# Patient Record
Sex: Female | Born: 1960 | Race: White | Hispanic: No | Marital: Married | Smoking: Never smoker
Health system: Southern US, Community
[De-identification: ages and names within clinical notes are randomized; demographics above are authoritative.]

## PROBLEM LIST (undated history)

## (undated) DIAGNOSIS — G479 Sleep disorder, unspecified: Secondary | ICD-10-CM

## (undated) DIAGNOSIS — Z780 Asymptomatic menopausal state: Secondary | ICD-10-CM

## (undated) DIAGNOSIS — R112 Nausea with vomiting, unspecified: Secondary | ICD-10-CM

## (undated) DIAGNOSIS — H409 Unspecified glaucoma: Secondary | ICD-10-CM

## (undated) DIAGNOSIS — E785 Hyperlipidemia, unspecified: Secondary | ICD-10-CM

## (undated) DIAGNOSIS — K219 Gastro-esophageal reflux disease without esophagitis: Secondary | ICD-10-CM

## (undated) DIAGNOSIS — F32A Depression, unspecified: Secondary | ICD-10-CM

## (undated) DIAGNOSIS — Z9889 Other specified postprocedural states: Secondary | ICD-10-CM

## (undated) DIAGNOSIS — Z8719 Personal history of other diseases of the digestive system: Secondary | ICD-10-CM

## (undated) DIAGNOSIS — Z87442 Personal history of urinary calculi: Secondary | ICD-10-CM

## (undated) DIAGNOSIS — N200 Calculus of kidney: Secondary | ICD-10-CM

## (undated) DIAGNOSIS — E739 Lactose intolerance, unspecified: Secondary | ICD-10-CM

## (undated) DIAGNOSIS — R519 Headache, unspecified: Secondary | ICD-10-CM

## (undated) DIAGNOSIS — R51 Headache: Secondary | ICD-10-CM

## (undated) DIAGNOSIS — M1990 Unspecified osteoarthritis, unspecified site: Secondary | ICD-10-CM

## (undated) DIAGNOSIS — I10 Essential (primary) hypertension: Secondary | ICD-10-CM

## (undated) DIAGNOSIS — F329 Major depressive disorder, single episode, unspecified: Secondary | ICD-10-CM

## (undated) HISTORY — DX: Unspecified glaucoma: H40.9

## (undated) HISTORY — PX: OTHER SURGICAL HISTORY: SHX169

## (undated) HISTORY — DX: Essential (primary) hypertension: I10

## (undated) HISTORY — DX: Hyperlipidemia, unspecified: E78.5

## (undated) HISTORY — DX: Lactose intolerance, unspecified: E73.9

## (undated) HISTORY — PX: UPPER GASTROINTESTINAL ENDOSCOPY: SHX188

## (undated) HISTORY — DX: Calculus of kidney: N20.0

## (undated) HISTORY — DX: Depression, unspecified: F32.A

## (undated) HISTORY — PX: ABDOMINAL SURGERY: SHX537

## (undated) HISTORY — DX: Major depressive disorder, single episode, unspecified: F32.9

## (undated) HISTORY — PX: EYE SURGERY: SHX253

## (undated) HISTORY — DX: Asymptomatic menopausal state: Z78.0

## (undated) HISTORY — PX: COLONOSCOPY: SHX174

---

## 1997-09-19 ENCOUNTER — Ambulatory Visit (HOSPITAL_COMMUNITY): Admission: RE | Admit: 1997-09-19 | Discharge: 1997-09-19 | Payer: Self-pay | Admitting: Obstetrics & Gynecology

## 1997-09-27 ENCOUNTER — Ambulatory Visit (HOSPITAL_COMMUNITY): Admission: RE | Admit: 1997-09-27 | Discharge: 1997-09-27 | Payer: Self-pay | Admitting: Obstetrics & Gynecology

## 1998-05-03 ENCOUNTER — Encounter: Payer: Self-pay | Admitting: Obstetrics & Gynecology

## 1998-05-03 ENCOUNTER — Ambulatory Visit (HOSPITAL_COMMUNITY): Admission: RE | Admit: 1998-05-03 | Discharge: 1998-05-03 | Payer: Self-pay | Admitting: Obstetrics & Gynecology

## 1998-10-04 ENCOUNTER — Other Ambulatory Visit: Admission: RE | Admit: 1998-10-04 | Discharge: 1998-10-04 | Payer: Self-pay | Admitting: Obstetrics & Gynecology

## 1998-12-03 ENCOUNTER — Encounter: Payer: Self-pay | Admitting: Obstetrics & Gynecology

## 1998-12-03 ENCOUNTER — Ambulatory Visit (HOSPITAL_COMMUNITY): Admission: RE | Admit: 1998-12-03 | Discharge: 1998-12-03 | Payer: Self-pay | Admitting: Obstetrics & Gynecology

## 2000-03-26 ENCOUNTER — Ambulatory Visit (HOSPITAL_COMMUNITY): Admission: RE | Admit: 2000-03-26 | Discharge: 2000-03-26 | Payer: Self-pay | Admitting: Obstetrics & Gynecology

## 2000-03-26 ENCOUNTER — Encounter: Payer: Self-pay | Admitting: Obstetrics & Gynecology

## 2000-03-30 ENCOUNTER — Other Ambulatory Visit: Admission: RE | Admit: 2000-03-30 | Discharge: 2000-03-30 | Payer: Self-pay | Admitting: Obstetrics & Gynecology

## 2001-04-07 ENCOUNTER — Emergency Department (HOSPITAL_COMMUNITY): Admission: EM | Admit: 2001-04-07 | Discharge: 2001-04-07 | Payer: Self-pay | Admitting: Emergency Medicine

## 2001-06-08 ENCOUNTER — Other Ambulatory Visit: Admission: RE | Admit: 2001-06-08 | Discharge: 2001-06-08 | Payer: Self-pay | Admitting: Obstetrics & Gynecology

## 2002-08-01 ENCOUNTER — Other Ambulatory Visit: Admission: RE | Admit: 2002-08-01 | Discharge: 2002-08-01 | Payer: Self-pay | Admitting: Obstetrics & Gynecology

## 2003-06-15 ENCOUNTER — Encounter: Admission: RE | Admit: 2003-06-15 | Discharge: 2003-06-15 | Payer: Self-pay | Admitting: Obstetrics & Gynecology

## 2003-08-22 ENCOUNTER — Other Ambulatory Visit: Admission: RE | Admit: 2003-08-22 | Discharge: 2003-08-22 | Payer: Self-pay | Admitting: Obstetrics & Gynecology

## 2007-09-29 HISTORY — PX: ABDOMINAL HYSTERECTOMY: SHX81

## 2007-10-26 ENCOUNTER — Encounter (INDEPENDENT_AMBULATORY_CARE_PROVIDER_SITE_OTHER): Payer: Self-pay | Admitting: Obstetrics & Gynecology

## 2007-10-27 ENCOUNTER — Inpatient Hospital Stay (HOSPITAL_COMMUNITY): Admission: RE | Admit: 2007-10-27 | Discharge: 2007-10-28 | Payer: Self-pay | Admitting: Obstetrics & Gynecology

## 2008-08-21 ENCOUNTER — Emergency Department (HOSPITAL_COMMUNITY): Admission: EM | Admit: 2008-08-21 | Discharge: 2008-08-22 | Payer: Self-pay | Admitting: Emergency Medicine

## 2008-08-22 ENCOUNTER — Ambulatory Visit: Payer: Self-pay | Admitting: *Deleted

## 2008-08-22 ENCOUNTER — Inpatient Hospital Stay (HOSPITAL_COMMUNITY): Admission: AD | Admit: 2008-08-22 | Discharge: 2008-08-23 | Payer: Self-pay | Admitting: *Deleted

## 2008-12-26 ENCOUNTER — Emergency Department (HOSPITAL_COMMUNITY): Admission: EM | Admit: 2008-12-26 | Discharge: 2008-12-26 | Payer: Self-pay | Admitting: Emergency Medicine

## 2009-03-15 DIAGNOSIS — F329 Major depressive disorder, single episode, unspecified: Secondary | ICD-10-CM | POA: Insufficient documentation

## 2009-03-15 DIAGNOSIS — I10 Essential (primary) hypertension: Secondary | ICD-10-CM | POA: Insufficient documentation

## 2009-03-16 ENCOUNTER — Ambulatory Visit: Payer: Self-pay | Admitting: Pulmonary Disease

## 2009-03-16 DIAGNOSIS — R930 Abnormal findings on diagnostic imaging of skull and head, not elsewhere classified: Secondary | ICD-10-CM

## 2010-10-07 LAB — COMPREHENSIVE METABOLIC PANEL
Albumin: 3.8 g/dL (ref 3.5–5.2)
Alkaline Phosphatase: 72 U/L (ref 39–117)
BUN: 17 mg/dL (ref 6–23)
Calcium: 9.3 mg/dL (ref 8.4–10.5)
Creatinine, Ser: 0.79 mg/dL (ref 0.4–1.2)
Glucose, Bld: 129 mg/dL — ABNORMAL HIGH (ref 70–99)
Potassium: 3.3 mEq/L — ABNORMAL LOW (ref 3.5–5.1)
Total Protein: 6.8 g/dL (ref 6.0–8.3)

## 2010-10-07 LAB — URINE CULTURE: Colony Count: >=100000

## 2010-10-07 LAB — DIFFERENTIAL
Lymphocytes Relative: 29 % (ref 12–46)
Lymphs Abs: 2.5 10*3/uL (ref 0.7–4.0)
Monocytes Absolute: 0.4 10*3/uL (ref 0.1–1.0)
Monocytes Relative: 5 % (ref 3–12)
Neutro Abs: 5.7 10*3/uL (ref 1.7–7.7)
Neutrophils Relative %: 64 % (ref 43–77)

## 2010-10-07 LAB — CBC
HCT: 38.2 % (ref 36.0–46.0)
MCHC: 34.3 g/dL (ref 30.0–36.0)
Platelets: 235 10*3/uL (ref 150–400)
RDW: 12.4 % (ref 11.5–15.5)

## 2010-10-07 LAB — POCT I-STAT, CHEM 8
BUN: 19 mg/dL (ref 6–23)
Chloride: 104 mEq/L (ref 96–112)
Creatinine, Ser: 0.8 mg/dL (ref 0.4–1.2)
Glucose, Bld: 123 mg/dL — ABNORMAL HIGH (ref 70–99)
Potassium: 3.4 mEq/L — ABNORMAL LOW (ref 3.5–5.1)
Sodium: 139 mEq/L (ref 135–145)

## 2010-10-07 LAB — URINE MICROSCOPIC-ADD ON

## 2010-10-07 LAB — URINALYSIS, ROUTINE W REFLEX MICROSCOPIC
Ketones, ur: NEGATIVE mg/dL
Protein, ur: 30 mg/dL — AB
Urobilinogen, UA: 1 mg/dL (ref 0.0–1.0)

## 2010-10-15 LAB — ACETAMINOPHEN LEVEL: Acetaminophen (Tylenol), Serum: 91.1 ug/mL — ABNORMAL HIGH (ref 10–30)

## 2010-10-15 LAB — ETHANOL: Alcohol, Ethyl (B): <5 mg/dL (ref 0–10)

## 2010-10-15 LAB — COMPREHENSIVE METABOLIC PANEL
ALT: 17 U/L (ref 0–35)
Albumin: 4.2 g/dL (ref 3.5–5.2)
Alkaline Phosphatase: 67 U/L (ref 39–117)
BUN: 16 mg/dL (ref 6–23)
Chloride: 101 mEq/L (ref 96–112)
Glucose, Bld: 139 mg/dL — ABNORMAL HIGH (ref 70–99)
Potassium: 3.4 mEq/L — ABNORMAL LOW (ref 3.5–5.1)
Sodium: 137 mEq/L (ref 135–145)
Total Bilirubin: 0.8 mg/dL (ref 0.3–1.2)
Total Protein: 6.8 g/dL (ref 6.0–8.3)

## 2010-10-15 LAB — DIFFERENTIAL
Basophils Absolute: 0 10*3/uL (ref 0.0–0.1)
Basophils Relative: 0 % (ref 0–1)
Eosinophils Absolute: 0 10*3/uL (ref 0.0–0.7)
Eosinophils Relative: 0 % (ref 0–5)
Monocytes Absolute: 0.4 10*3/uL (ref 0.1–1.0)
Monocytes Relative: 4 % (ref 3–12)

## 2010-10-15 LAB — URINALYSIS, ROUTINE W REFLEX MICROSCOPIC
Bilirubin Urine: NEGATIVE
Hgb urine dipstick: NEGATIVE
Specific Gravity, Urine: 1.007 (ref 1.005–1.030)
Urobilinogen, UA: 0.2 mg/dL (ref 0.0–1.0)
pH: 6 (ref 5.0–8.0)

## 2010-10-15 LAB — CBC
HCT: 39.8 % (ref 36.0–46.0)
Hemoglobin: 13.5 g/dL (ref 12.0–15.0)
Platelets: 223 10*3/uL (ref 150–400)
RDW: 12.5 % (ref 11.5–15.5)
WBC: 11.4 10*3/uL — ABNORMAL HIGH (ref 4.0–10.5)

## 2010-11-12 NOTE — Op Note (Signed)
[name], [name]               ACCOUNT NO.:  [id number]   MEDICAL RECORD NO.:  [id number]          PATIENT TYPE:  OIB   LOCATION:  9302                          FACILITY:  [facility]   PHYSICIAN:  Freddy Finner, M.D.   DATE OF BIRTH:  1961-01-05   DATE OF PROCEDURE:  10/26/2007  DATE OF DISCHARGE:                               OPERATIVE REPORT   PREOPERATIVE DIAGNOSIS:  Fibroids, failed endometrial ablation  endometrial polyp.   POSTOPERATIVE DIAGNOSIS:  Fibroids, failed endometrial ablation  endometrial polyp, minimal evidence of pelvic endometriosis.   OPERATIVE PROCEDURE:  Laparoscopic-assisted vaginal hysterectomy,  bilateral salpingo-oophorectomy, fulguration of endometriosis of cul-de-  sac.   SURGEON:  Freddy Finner, M.D.   ASSISTANT:  Miguel Aschoff, M.D.   ANESTHESIA:  General endotracheal.   INTRAOPERATIVE COMPLICATIONS:  None.   ESTIMATED INTRAOPERATIVE BLOOD LOSS:  200 mL.   Details of present illness recorded in the admission note.  The patient  was admitted on the morning of surgery.  She was given a bolus of Ancef.  She was placed in PAS hose.  She was brought to the operating room,  placed under general anesthesia, placed in dorsolithotomy position.  Allen stirrups were used.  Betadine prep of abdomen, perineum and vagina  was carried out in the usual fashion with scrub followed by a solution.  The bladder was evacuated with a Robinson catheter. Hulka tenaculum was  attached to the cervix under direct visualization.  Sterile drapes were  applied.  Two small incisions were made, one in the umbilicus, one just  above the symphysis.  An 11 mm bladed disposable trocar was introduced  through the umbilical incision while elevating anterior abdominal wall  manually.  Direct inspection revealed adequate placement without  evidence of injury on entry.  Pneumoperitoneum was allowed to accumulate  with carbon dioxide gas.  Scanning inspection of the upper abdomen and  pelvis was carried out.  No apparent abnormalities were noted in the  upper abdomen including a normal appendix which was visualized.  The  uterus was irregularly nodular.  The tubes and ovaries were normal.  There were approximately 3 powder burn lesions in the cul-de-sac.  These  were fulgurated with bipolar forceps.  A second trocar was used during  the procedure.  It was placed in a small incision just above the  symphysis.  Through it, __________ grasping forceps were used to elevate  the adnexa.  Using the tripolar Gyrus device progressive pedicles were  developed including the infundibulopelvic ligament, round ligament,  upper broad ligament to free the tubes and ovaries.  The dissection was  carried to a level approximately at the uterine arteries.  This was done  on both sides.  Attention was then turned vaginally.  Posterior weighted  vaginal retractor was placed.  Colpotomy incision was made while tenting  mucosa posterior to the cervix.  There was a small vaginal laceration  with the first attempt which required two figure-of-eight sutures to  close the defect.  This was where there was a fold of tissue in the cul-  de-sac.  Ultimately the  cul-de-sac was entered safely with no injury.  Bonanno catheter was placed.  Cervix was grasped with Christella Hartigan tenaculum  to allow release of the mucosa from the cervix with a scalpel.  Progressive pedicles were then taken using the LigaSure system.  The  uterosacrals were sealed and divided sharply as were the bladder  pillars.  Bladder was further advanced off the cervix.  This was done  with careful blunt and sharp dissection.  The anterior peritoneum was  entered.  Cardinal ligament pedicles were sealed and divided with the  LigaSure system.  The uterine artery pedicles were sealed and divided  with the LigaSure system.  This allowed delivery of the uterus.  Angles  of the vagina were then anchored to uterosacrals with a mattress suture  of  0 Monocryl.  Posterior peritoneum was closed and the uterosacrals  approximated with interrupted 0 Monocryl.  The cuff was closed  vertically with figure-of-eights of 0 Monocryl.  A Foley catheter was  placed.  Reinspection was carried out laparoscopically.  The Nezhat  irrigation system was used to irrigate the pedicles.  A couple of small  minimal bleeding sources were controlled on the right.  Hemostasis was  confirmed under reduced intra-abdominal pressure irrigation.  The  irrigating solution was aspirated from the abdomen.  The gas was allowed  to escape from the abdomen and the skin incisions were closed with  interrupted subcuticular sutures of 3-0 Dexon.  Steri-Strips were  applied to the lower incision.  Sterile dressing was applied to the  umbilicus.  The patient was awakened and taken to recovery in good  condition.      Freddy Finner, M.D.  Electronically Signed     WRN/MEDQ  D:  10/26/2007  T:  10/26/2007  Job:  [id number]

## 2010-11-12 NOTE — H&P (Signed)
[name], [name]               ACCOUNT NO.:  [id number]   MEDICAL RECORD NO.:  [id number]          PATIENT TYPE:  AMB   LOCATION:  SDC                           [facility]ILITY:  [facility]   PHYSICIAN:  Freddy Finner, M.D.   DATE OF BIRTH:  19-Aug-1960   DATE OF ADMISSION:  DATE OF DISCHARGE:                              HISTORY & PHYSICAL   ADMITTING DIAGNOSES:  1. Multiple uterine leiomyomata.  2. Menorrhagia with heavy menstrual bleeding.  3. Sonohysterographic evidence of an endometrial polyp following      previous endometrial ablation.  4. Request for definitive surgical intervention.   HISTORY OF PRESENT ILLNESS:  The patient is a 50 year old white married  female gravida 2, para 2 who has been followed in my office since 1996.  In 1988, she had laparoscopic diagnosis of pelvic endometriosis and long-  term sterilization with laser vaporization of pelvic endometriosis and  transection of utero-sacral ligaments with laser.  D&C was performed for  dysfunctional bleeding with benign endometriosis findings at that time.  Over the interval of time since her initial presentation, she has been  followed on a regular basis and was found to have uterine leiomyomata.  Sonohysterogram performed in 2008 with primary finding only of uterine  leiomyomata, because of that she had hysteroscopy D&C with benign  endometrial findings in March 2008 and had a NovaSure endometrial  ablation.  This initially provided significant improvement in her  menorrhagia and symptoms, but over time she had progressively increased  in menses and in very recent past had a repeat sonohysterogram with  findings as noted above, showing a recurrence of an endometrial polyp  and with numerous uterine leiomyomata.  She also has experienced some  recent premenopausal like symptoms.  Given the options of therapy, at  this point, the patient has requested more definitive surgical  interventions, admitted now for  laparoscopic-assisted vaginal  hysterectomy and  bilateral salpingo-oophorectomy.  The potential risk  of the procedure including hemorrhage, injury to other organs,  postoperative infection, and potential for converting to an abdominal  procedure has been discussed.  She appears to be completely satisfied  with the discussion and consultation and is admitted now for surgery.   REVIEW OF SYSTEMS:  Her current review of systems is otherwise negative.   PAST MEDICAL HISTORY:  She does have some gastroesophageal reflux for  which she takes Protonix 40 mg a day.  She has had some anxiety and mild  depression for which she was treated with Effexor extended release 150  mg  a day.  She has mild hypertension for which she was treated with  lisinopril and hydrochlorothiazide.   PREVIOUS SURGICAL PROCEDURES:  Included the one's noted above as well as  she has had 2 cesarean deliveries.   SOCIAL HISTORY:  She does not use cigarettes.   ALLERGIES:  She has no known allergies to medication except PENICILLIN.   She has never had a blood transfusion.  She has recently not tried to  avail transdermal system for estrogen replacement as well as determining  her ability as tolerated postoperatively.  FAMILY HISTORY:  Noncontributory.   PHYSICAL EXAMINATION:  HEENT:  Grossly within normal limits.  VITAL SIGNS:  Blood pressure in the office 118/88.  THYROID GLAND:  Not palpably enlarged.  CHEST:  Clear to auscultation.  HEART:  Normal sinus rhythm, without murmurs, rubs, or gallops.  BREASTS:  Considered to be normal.  No palpable masses.  No nipple  discharge.  SKIN:  No skin change.  ABDOMEN:  Soft, nontender without appreciable organomegaly or palpable  masses.  EXTREMITIES:  Without cyanosis, clubbing, or edema.  PELVIC:  External genitalia, vagina, and cervix are normal to  inspection. Bimanual reveals the uterus to be approximately 8-9 weeks'  gestation size.  There are no palpable  adnexal masses.  The rectum is  normal and rectovaginal exam confirms the above findings.   ASSESSMENT:  Uterine leiomyomata failed endometrial ablation with  current endometrial polyp.  Request for definitive surgical  intervention.   PLAN:  Laparoscopic-assisted vaginal hysterectomy and bilateral salpingo-  oophorectomy.      Freddy Finner, M.D.  Electronically Signed     WRN/MEDQ  D:  10/25/2007  T:  10/26/2007  Job:  [id number]

## 2010-11-12 NOTE — H&P (Signed)
NAMESHEREECE, WELLBORN               ACCOUNT NO.:  [id number]   MEDICAL RECORD NO.:  [id number]          PATIENT TYPE:  IPS   LOCATION:  0306                          [facility]ILITY:  [facility]   PHYSICIAN:  Landry Corporal, N.P.    DATE OF BIRTH:  02/22/1961   DATE OF ADMISSION:  08/22/2008  DATE OF DISCHARGE:  08/23/2008                       PSYCHIATRIC ADMISSION ASSESSMENT   PATIENT IDENTIFICATION:  This is a 50 year old female voluntarily  admitted on August 21, 2008.   HISTORY OF PRESENT ILLNESS:  The patient overdosed on Tylenol PM.  She  states she is unclear as to how many she took.  She states she poured  some of her hand.  She states that her husband had found out about the  overdose was taken to the emergency department.  The patient reports  that she is tired of being in accused.  She states her daughter-in-law  is concerned that this patient does not treat her right, but does not  state any specifics and the patient is afraid that she may not get to  see her grandchild because of this.  She states that her daughter-in-  law's mother had also made the patient upset which appeared to prompt  the overdose.   PAST PSYCHIATRIC HISTORY:  First admission to [facility].  Did have some outpatient mental health treatment at [facility] in the  past, but was unable to continue due to financial reasons.   MEDICATION:  Has been on Zoloft in the past, had problems with headaches  and now has been changed to Effexor.   SOCIAL HISTORY:  She is married.  She lives in [place].  Lives with  her husband.  She works as a Surveyor, mining.   FAMILY HISTORY:  None.   ALCOHOL AND DRUG HABITS:  Denies any alcohol or drug use.   PRIMARY CARE Adalid Beckmann:  Dr. Herb Grays.   MEDICAL PROBLEMS:  Hypertension and GERD.   MEDICATIONS:  1. Protonix 40 mg.  2. Effexor XR 150 mg daily.  3. Multivitamin.  4. Hormone patch.  5. Lisinopril 10/12.5 mg daily.   ALLERGIES:  PENICILLIN,  PROBLEMS WITH SWELLING.   PHYSICAL EXAMINATION:  GENERAL:  This is a middle-aged female.  She  appears in no acute distress.  She denies any complaints fully assessed  at [facility].  Physical exam shows the patient was  tachycardiac with a flat affect.  Remainder of exam appears  unremarkable.  She was well-developed and well-nourished.  VITAL SIGNS:  Temperature 97.3, 80 heart rate, 60 respirations, blood  pressure is 113/79.  She is 5 feet, 2-1/2 inch tall, 149 pounds.   LABORATORY DATA:  Initial acetaminophen level of 91 down to 41.5 at  transfer.  Alcohol level less than five.  Salicylate level less than 4,  potassium at 3.4.  WBC count is at 11.4.  She did receive fluids.  Poison Control was noted 9 kg.   MENTAL STATUS EXAM:  This is a fully alert, cooperative female, good eye  contact, appropriately dressed.  Speech is clear, normal pace and tone.  The patient's mood is  depressed.  Upset about the situation with her  daughter-in-law and with her son who also was stating that he does not  want to come to the house anymore as well.  She appears sad.  Thought  processes are coherent and goal directed.  No evidence of any delusional  statements.  Cognitive function intact.  Her memory appears intact.  Judgment insight is fair.   DIAGNOSES:  AXIS I:  Major depressive disorder.  AXIS II:  Deferred.  AXIS III:  Status post Tylenol overdose, hypertension, GERD.  AXIS IV:  Problems with primary support group with daughter-in-law and  possible relations with her son.  AXIS V:  Current is 35.  We will continue with the patient's medications  at this time.  Will stabilize her mood and thinking.  We will have a  family session with her husband for support and concerns.  We will get a  case manager will get a follow-up appointment for the patient.  Tentative length of stay is 3 days.      Landry Corporal, N.P.     JO/MEDQ  D:  08/23/2008  T:  08/23/2008  Job:  [id number]

## 2010-11-15 NOTE — Discharge Summary (Signed)
NAMEAZARYA, OCONNELL               ACCOUNT NO.:  [id number]   MEDICAL RECORD NO.:  [id number]           PATIENT TYPE:   LOCATION:                                 FACILITY:   PHYSICIAN:  Freddy Finner, M.D.   DATE OF BIRTH:  [birth year]   DATE OF ADMISSION:  10/27/2007  DATE OF DISCHARGE:  10/28/2007                               DISCHARGE SUMMARY   Benign uterine leiomyomata, uterine adenomyosis, and benign follicular  cysts of ovary.   OPERATIVE PROCEDURE:  Laparoscopically-assisted vaginal hysterectomy and  bilateral salpingo-oophorectomy.   SECONDARY DIAGNOSES:  Failed endometrial ablation and recurrent  endometrial polyp, not identified on the path report.   INTRAOPERATIVE AND POSTOPERATIVE COMPLICATIONS:  None.   DISPOSITION:  The patient was in improved, satisfactory condition on the  evening of the first postoperative day.  She was discharged home on a  regular diet with progressively increasing physical activity.  She is to  avoid vaginal entry of any kind.  She is to avoid heavy lifting.  She is  to return to the office in approximately 2 weeks for postoperative  followup.  She was given Vivelle 0.1 Dot for hormone replacement  therapy.  She was given Percocet 5/325 for pain.  She is to take a  regular diet.  She is to call for fever or heavy bleeding or severe  pain.   Details of the present illness are recorded in the admission note.  Physical findings on admission are remarkable only for the enlargement  of uterus with fibroids and for polyp identified on sonohysterogram with  recurrent abnormal uterine bleeding.  She has had an endometrial  ablation and the polyp was unexpected.  Options of hysteroscopy and D&C  were discussed with her as well as the laparoscopically-assisted  hysterectomy.  She prefers to proceed with the bladder present and was  admitted at this time for that purpose.   LABORATORY DATA DURING THIS ADMISSION:  Included a CBC with a normal  hemoglobin, normal white count, and normal platelet count on admission.  Postoperative hemoglobin was 11.6.  Prothrombin time, PTT, and INR were  normal on admission as was the serum electrolytes panel except for  slightly low sodium of 132.   HOSPITAL COURSE:  Following admission, the patient was treated  preoperatively with IV antibiotic and with PAS compression hose to lower  extremity.  She was taken to the operating room where the above-  described procedure was accomplished without significant difficulty or  any intraoperative complications.  On further notation, there were too  small endometriotic lesions in the cul-de-sac, which were fulgurated.  Her postoperative course was uncomplicated.  She remained afebrile  throughout the first postoperative day.  She did have mild issue with  return of  adequate bladder function, but this was resolved by the evening of the  first postoperative day.  At that time, she was discharged home.   DISPOSITION:  As noted above.      Freddy Finner, M.D.  Electronically Signed     WRN/MEDQ  D:  11/13/2007  T:  11/14/2007  Job:  [id number] 

## 2010-11-15 NOTE — Discharge Summary (Signed)
[name], [name]               ACCOUNT NO.:  [id number]   MEDICAL RECORD NO.:  [id number]          PATIENT TYPE:  IPS   LOCATION:  0306                          [facility]ILITY[facility]   PHYSICIAN:  Jasmine Pang, M.D. DATE OF BIRTH:  07/02/1960   DATE OF ADMISSION:  08/21/2008  DATE OF DISCHARGE:  08/23/2008                               DISCHARGE SUMMARY   IDENTIFICATION:  This is a 50 year old married female who was admitted  on a voluntary basis on August 21, 2008.   HISTORY OF PRESENT ILLNESS:  The patient overdosed on Tylenol PM.  She  states she is unclear as to how many she took.  She states she reports  some of her in her hand.  She states that her husband had found out  about the overdose and she was taken to the emergency department.  The  patient reports that she is tired of being accused.  She states her  daughter-in-law was concerned that this patient does not treat her  right.  She does not state any specifics and the patient is afraid that  she may not get to see her grandchild because of this.  She states that  her daughter-in-law, mother has also made to the patient upset, which  appeared to prop the overdose.   PAST PSYCHIATRIC HISTORY:  This is the first admission to [facility].  She did have some outpatient mental health treatment  course done in the past but was unable to continued due to financial  reasons.   MEDICATION:  The patient has been on Zoloft in the past.  She had  problems with headaches.  It has now been changed to Effexor.   FAMILY HISTORY:  None.   ALCOHOL AND DRUG HISTORY:  She denies any alcohol or drug use.   MEDICAL PROBLEMS:  Hypertension and GERD.   MEDICATIONS:  Protonix 40 mg daily, Effexor XR 150 mg daily,  multivitamin, hormone patch, and lisinopril 10/12.5 mg daily.   ALLERGIES:  Penicillin and (problems with swelling).   PHYSICAL EXAM:  There were no acute physical or medical problems noted.  She was fully  assessed at [facility] ED.   LABORATORY DATA:  On initial acetaminophen level was 91 was down to 41.5  at transfer.  Alcohol level was less than 5.  Salicylate level less than  4, potassium was 3.4.  CBC, WBC count is 11.4.   HOSPITAL COURSE:  Upon admission, the patient was continued on her home  medications of Protonix 40 mg daily, Effexor XR 150 mg daily,  lisinopril/ hydrochlorothiazide 10/12.5 mg p.o. daily, multivitamin 1  p.o. daily, hormone patch placed every Thursday and Sunday.  She was  also placed on Ambien 10 mg p.o. q.h.s. p.r.n. insomnia.  In individual  sessions, the patient was friendly and cooperative.  She stated she  just wanted to go to sleep.  She denied she was trying to hurt  herself.  She said her husband is supportive.  She denied use of  alcohol, drugs, or cigarettes.  She had a psychomotor retardation, but  speech was normal rate and flow.  She was depressed and anxious with  tearful affect and moderate anxiety level.  There was no thought  disorder or psychosis noted.  On August 23, 2008, mental status had  improved markedly from admission status.  Sleep was good.  Appetite was  good.  Mood was less depressed, less anxious.  Affect was consistent  with mood.  There was no suicidal or homicidal ideation.  No thoughts of  self injurious behavior.  No auditory or visual hallucinations.  No  paranoia or delusions.  Thoughts were logical and goal-directed.  Thought content, no predominant theme. Cognitive was grossly intact.  Insight good.  Judgment good.  Impulse control good.  She was felt to be  safe to be discharged today and wanted to go home.   DISCHARGE DIAGNOSES:  Axis I: Depressive disorder NOS.  Axis II:  None  Axis III: Status post Tylenol overdose, hypertension, gastroesophageal  reflux disease.Marland Kitchen  Axis IV:  Severe (problems with primary support group with daughter-in-  law and possible relations poor relations with her son, burden of   psychiatric illness.)  Axis V:  GAF was 15 upon discharge.  GAF was 35 upon admission.  Marland Kitchen  GAF  highest past year was 70-75.   DISCHARGE/PLAN:  There were no specific activity level or dietary  restrictions.   POST HOSPITAL CARE PLANS:  The patient will [facility].  She will see Kellie Moor on August 24, 2008, at 10:40. a.m.  and [place] [facility] to see Saul Fordyce for medication  management on August 29, 2008, at 3:15 p.m..   DISCHARGE MEDICATIONS:  Effexor XR 150 mg daily, Protonix 40 mg daily,  lisinopril 10/12.5 mg daily, multivitamins daily, and hormone patch  daily.      Jasmine Pang, M.D.  Electronically Signed     BHS/MEDQ  D:  09/14/2008  T:  09/15/2008  Job:  [id number]

## 2011-03-25 LAB — CBC
HCT: 37.2
MCHC: 34.8
MCHC: 35
MCV: 88.4
MCV: 89.7
Platelets: 271
RBC: 3.73 — ABNORMAL LOW
RDW: 12.3
RDW: 12.5

## 2011-03-25 LAB — BASIC METABOLIC PANEL
BUN: 9
CO2: 26
Chloride: 101
Creatinine, Ser: 0.73
GFR calc Af Amer: >60
Glucose, Bld: 92

## 2011-03-25 LAB — PROTIME-INR: Prothrombin Time: 13.1

## 2011-04-10 ENCOUNTER — Ambulatory Visit (INDEPENDENT_AMBULATORY_CARE_PROVIDER_SITE_OTHER): Payer: 59 | Admitting: Internal Medicine

## 2011-04-10 ENCOUNTER — Encounter: Payer: Self-pay | Admitting: Internal Medicine

## 2011-04-10 VITALS — BP 120/68 | HR 90 | Temp 97.2°F | Resp 12 | Ht 62.0 in | Wt 150.0 lb

## 2011-04-10 DIAGNOSIS — Z780 Asymptomatic menopausal state: Secondary | ICD-10-CM | POA: Insufficient documentation

## 2011-04-10 DIAGNOSIS — N951 Menopausal and female climacteric states: Secondary | ICD-10-CM

## 2011-04-10 DIAGNOSIS — N200 Calculus of kidney: Secondary | ICD-10-CM | POA: Insufficient documentation

## 2011-04-10 DIAGNOSIS — E739 Lactose intolerance, unspecified: Secondary | ICD-10-CM | POA: Insufficient documentation

## 2011-04-10 DIAGNOSIS — I10 Essential (primary) hypertension: Secondary | ICD-10-CM

## 2011-04-10 LAB — CBC WITH DIFFERENTIAL/PLATELET
Eosinophils Relative: 2 % (ref 0–5)
HCT: 38.9 % (ref 36.0–46.0)
Hemoglobin: 13.1 g/dL (ref 12.0–15.0)
Lymphocytes Relative: 32 % (ref 12–46)
Lymphs Abs: 1.9 10*3/uL (ref 0.7–4.0)
MCV: 89 fL (ref 78.0–100.0)
Monocytes Absolute: 0.4 10*3/uL (ref 0.1–1.0)
Monocytes Relative: 6 % (ref 3–12)
RBC: 4.37 MIL/uL (ref 3.87–5.11)
WBC: 5.9 10*3/uL (ref 4.0–10.5)

## 2011-04-10 LAB — COMPREHENSIVE METABOLIC PANEL
ALT: 13 U/L (ref 0–35)
BUN: 16 mg/dL (ref 6–23)
CO2: 26 mEq/L (ref 19–32)
Calcium: 10 mg/dL (ref 8.4–10.5)
Chloride: 99 mEq/L (ref 96–112)
Creat: 0.65 mg/dL (ref 0.50–1.10)

## 2011-04-10 LAB — LIPID PANEL: HDL: 50 mg/dL (ref >39)

## 2011-04-10 LAB — TSH: TSH: 1.681 u[IU]/mL (ref 0.350–4.500)

## 2011-04-10 MED ORDER — ESTRADIOL 0.075 MG/24HR TD PTTW: 1.0000 | MEDICATED_PATCH | TRANSDERMAL | Status: DC

## 2011-04-10 NOTE — Patient Instructions (Signed)
Change vivelle dot patch 2 times per week  See me in 3 months

## 2011-04-10 NOTE — Progress Notes (Signed)
Subjective:    Patient ID: [name], female    DOB: 06/16/1961, 50 y.o.   MRN: [id number]  HPI New pt here for first visit. Pt of Dr. Andrey [name] in [place].  PMH of HTN, Depression, symptomatic menopause, lactose intolerance and renal calculi  Overall doing well except for symptomatic menopause.  She has up to 5 vasomotor flushes a day and occasionally some at night.  She also has vaginal dryness.  She is not aware of much of her family history as she states her mother was hypochondriacal and her father was estranged.  She reports she was on low dose Vivelle dot in past and some gel but has not been on any HT in a while.  No history of DVT or PE no history of MI or breast or GYN cancers.  Allergies  Allergen Reactions  . Penicillins Swelling    tongue  . Prochlorperazine Edisylate Diarrhea   Past Medical History  Diagnosis Date  . Hypertension   . Depression   . Menopause    Past Surgical History  Procedure Date  . Abdominal hysterectomy 4/09    total  . Cesarean section 7/83   History   Social History  . Marital Status: Married    Spouse Name: N/A    Number of Children: N/A  . Years of Education: N/A   Occupational History  . Not on file.   Social History Main Topics  . Smoking status: Never Smoker   . Smokeless tobacco: Never Used  . Alcohol Use: No  . Drug Use: No  . Sexually Active: Yes    Birth Control/ Protection: Surgical   Other Topics Concern  . Not on file   Social History Narrative  . No narrative on file   Family History  Problem Relation Age of Onset  . Cancer Father     brown lung   Patient Active Problem List  Diagnoses  . DEPRESSION  . HYPERTENSION  . Nonspecific (abnormal) findings on radiological and other examination of body structure  . ABNORMAL CHEST XRAY   No current outpatient prescriptions on file prior to visit.       Review of Systems See HPI    Objective:   Physical Exam Physical Exam  Nursing note and  vitals reviewed.  Constitutional: She is oriented to person, place, and time. She appears well-developed and well-nourished.  HENT:  Head: Normocephalic and atraumatic.  Cardiovascular: Normal rate and regular rhythm. Exam reveals no gallop and no friction rub.  No murmur heard.  Pulmonary/Chest: Breath sounds normal. She has no wheezes. She has no rales.  Neurological: She is alert and oriented to person, place, and time.  Skin: Skin is warm and dry.  Psychiatric: She has a normal mood and affect. Her behavior is normal.         Assessment & Plan:  1) symptomatic Menopause:  Extensively reviewed risk/benefit of HT and pt signed sheet.  See scanned risk/benefit sheet.  She wishes to try HT again. Will start Vivelle dot at .Marland Kitchen075 mg to change twice weekly.  She is to return in 3 months and have BP checked at that time.  Will get copy of mammogram and check labs today.  Pt counseled will have to taper off HT after 5-6 years of use.  She voices understanding 2) HTN  Well controlled 3) Depression on Pristique and see a [facility] therapist. 4)  GERD 5)  Renal calculi by history 6) Lactose intolerance  I spent 45  minutes with this pt

## 2011-04-15 ENCOUNTER — Encounter: Payer: Self-pay | Admitting: Internal Medicine

## 2011-04-16 ENCOUNTER — Encounter: Payer: Self-pay | Admitting: Emergency Medicine

## 2011-07-10 ENCOUNTER — Ambulatory Visit: Payer: 59 | Admitting: Internal Medicine

## 2014-02-14 ENCOUNTER — Encounter (HOSPITAL_BASED_OUTPATIENT_CLINIC_OR_DEPARTMENT_OTHER): Payer: Self-pay | Admitting: Emergency Medicine

## 2014-02-14 ENCOUNTER — Emergency Department (HOSPITAL_BASED_OUTPATIENT_CLINIC_OR_DEPARTMENT_OTHER): Payer: 59

## 2014-02-14 ENCOUNTER — Emergency Department (HOSPITAL_BASED_OUTPATIENT_CLINIC_OR_DEPARTMENT_OTHER)
Admission: EM | Admit: 2014-02-14 | Discharge: 2014-02-14 | Disposition: A | Discharge status: Home / Self Care | Payer: 59 | Attending: Emergency Medicine | Admitting: Emergency Medicine

## 2014-02-14 DIAGNOSIS — Z862 Personal history of diseases of the blood and blood-forming organs and certain disorders involving the immune mechanism: Secondary | ICD-10-CM | POA: Diagnosis not present

## 2014-02-14 DIAGNOSIS — F329 Major depressive disorder, single episode, unspecified: Secondary | ICD-10-CM | POA: Insufficient documentation

## 2014-02-14 DIAGNOSIS — Z90710 Acquired absence of both cervix and uterus: Secondary | ICD-10-CM | POA: Insufficient documentation

## 2014-02-14 DIAGNOSIS — F3289 Other specified depressive episodes: Secondary | ICD-10-CM | POA: Insufficient documentation

## 2014-02-14 DIAGNOSIS — M549 Dorsalgia, unspecified: Secondary | ICD-10-CM | POA: Diagnosis not present

## 2014-02-14 DIAGNOSIS — Z79899 Other long term (current) drug therapy: Secondary | ICD-10-CM | POA: Insufficient documentation

## 2014-02-14 DIAGNOSIS — K8020 Calculus of gallbladder without cholecystitis without obstruction: Secondary | ICD-10-CM | POA: Insufficient documentation

## 2014-02-14 DIAGNOSIS — Z8742 Personal history of other diseases of the female genital tract: Secondary | ICD-10-CM | POA: Diagnosis not present

## 2014-02-14 DIAGNOSIS — Z87442 Personal history of urinary calculi: Secondary | ICD-10-CM | POA: Insufficient documentation

## 2014-02-14 DIAGNOSIS — Z9889 Other specified postprocedural states: Secondary | ICD-10-CM | POA: Insufficient documentation

## 2014-02-14 DIAGNOSIS — R109 Unspecified abdominal pain: Secondary | ICD-10-CM | POA: Diagnosis present

## 2014-02-14 DIAGNOSIS — Z8639 Personal history of other endocrine, nutritional and metabolic disease: Secondary | ICD-10-CM | POA: Insufficient documentation

## 2014-02-14 DIAGNOSIS — I10 Essential (primary) hypertension: Secondary | ICD-10-CM | POA: Diagnosis not present

## 2014-02-14 DIAGNOSIS — K8050 Calculus of bile duct without cholangitis or cholecystitis without obstruction: Secondary | ICD-10-CM

## 2014-02-14 DIAGNOSIS — Z880 Allergy status to penicillin: Secondary | ICD-10-CM | POA: Diagnosis not present

## 2014-02-14 LAB — COMPREHENSIVE METABOLIC PANEL
ALBUMIN: 3.9 g/dL (ref 3.5–5.2)
ALT: 12 U/L (ref 0–35)
ANION GAP: 12 (ref 5–15)
AST: 19 U/L (ref 0–37)
Alkaline Phosphatase: 101 U/L (ref 39–117)
BUN: 15 mg/dL (ref 6–23)
CO2: 27 mEq/L (ref 19–32)
CREATININE: 0.8 mg/dL (ref 0.50–1.10)
Calcium: 10.1 mg/dL (ref 8.4–10.5)
Chloride: 102 mEq/L (ref 96–112)
GFR calc Af Amer: >90 mL/min (ref >90)
GFR calc non Af Amer: 83 mL/min — ABNORMAL LOW (ref >90)
Glucose, Bld: 97 mg/dL (ref 70–99)
Potassium: 4.4 mEq/L (ref 3.7–5.3)
Sodium: 141 mEq/L (ref 137–147)
TOTAL PROTEIN: 7.2 g/dL (ref 6.0–8.3)
Total Bilirubin: <0.2 mg/dL — ABNORMAL LOW (ref 0.3–1.2)

## 2014-02-14 LAB — URINALYSIS, ROUTINE W REFLEX MICROSCOPIC
BILIRUBIN URINE: NEGATIVE
Glucose, UA: NEGATIVE mg/dL
HGB URINE DIPSTICK: NEGATIVE
Ketones, ur: NEGATIVE mg/dL
Leukocytes, UA: NEGATIVE
NITRITE: NEGATIVE
PROTEIN: NEGATIVE mg/dL
SPECIFIC GRAVITY, URINE: 1.029 (ref 1.005–1.030)
UROBILINOGEN UA: 0.2 mg/dL (ref 0.0–1.0)
pH: 5 (ref 5.0–8.0)

## 2014-02-14 LAB — CBC WITH DIFFERENTIAL/PLATELET
Basophils Absolute: 0 10*3/uL (ref 0.0–0.1)
Basophils Relative: 1 % (ref 0–1)
Eosinophils Absolute: 0.2 10*3/uL (ref 0.0–0.7)
Eosinophils Relative: 2 % (ref 0–5)
HEMATOCRIT: 38.7 % (ref 36.0–46.0)
Hemoglobin: 13.2 g/dL (ref 12.0–15.0)
LYMPHS PCT: 32 % (ref 12–46)
Lymphs Abs: 2.1 10*3/uL (ref 0.7–4.0)
MCH: 29.7 pg (ref 26.0–34.0)
MCHC: 34.1 g/dL (ref 30.0–36.0)
MCV: 87.2 fL (ref 78.0–100.0)
Monocytes Absolute: 0.4 10*3/uL (ref 0.1–1.0)
Monocytes Relative: 6 % (ref 3–12)
NEUTROS ABS: 3.8 10*3/uL (ref 1.7–7.7)
NEUTROS PCT: 59 % (ref 43–77)
Platelets: 238 10*3/uL (ref 150–400)
RBC: 4.44 MIL/uL (ref 3.87–5.11)
RDW: 12.8 % (ref 11.5–15.5)
WBC: 6.5 10*3/uL (ref 4.0–10.5)

## 2014-02-14 LAB — TROPONIN I: Troponin I: <0.3 ng/mL (ref <0.30)

## 2014-02-14 LAB — LIPASE, BLOOD: Lipase: 47 U/L (ref 11–59)

## 2014-02-14 MED ORDER — SODIUM CHLORIDE 0.9 % IV SOLN: INTRAVENOUS | Status: DC

## 2014-02-14 MED ORDER — IOHEXOL 300 MG/ML  SOLN
100.0000 mL | Freq: Once | INTRAMUSCULAR | Status: AC | PRN
  Administered 2014-02-14: 100 mL via INTRAVENOUS

## 2014-02-14 MED ORDER — HYDROMORPHONE HCL PF 1 MG/ML IJ SOLN
1.0000 mg | Freq: Once | INTRAMUSCULAR | Status: AC
  Administered 2014-02-14: 1 mg via INTRAVENOUS
  Filled 2014-02-14: qty 1

## 2014-02-14 MED ORDER — HYDROCODONE-ACETAMINOPHEN 5-325 MG PO TABS
1.0000 | ORAL_TABLET | Freq: Four times a day (QID) | ORAL | Status: DC | PRN
Start: 2014-02-14 — End: 2014-02-20

## 2014-02-14 MED ORDER — FAMOTIDINE 20 MG PO TABS: 20.0000 mg | ORAL_TABLET | Freq: Two times a day (BID) | ORAL | Status: DC

## 2014-02-14 MED ORDER — IOHEXOL 300 MG/ML  SOLN
50.0000 mL | Freq: Once | INTRAMUSCULAR | Status: AC | PRN
  Administered 2014-02-14: 50 mL via ORAL

## 2014-02-14 MED ORDER — ONDANSETRON HCL 4 MG/2ML IJ SOLN
4.0000 mg | Freq: Once | INTRAMUSCULAR | Status: AC
  Administered 2014-02-14: 4 mg via INTRAVENOUS
  Filled 2014-02-14: qty 2

## 2014-02-14 MED ORDER — SODIUM CHLORIDE 0.9 % IV BOLUS (SEPSIS)
500.0000 mL | Freq: Once | INTRAVENOUS | Status: AC
  Administered 2014-02-14: 500 mL via INTRAVENOUS

## 2014-02-14 MED ORDER — ONDANSETRON 4 MG PO TBDP: 4.0000 mg | ORAL_TABLET | Freq: Three times a day (TID) | ORAL | Status: DC | PRN

## 2014-02-14 MED ORDER — FAMOTIDINE 20 MG PO TABS
20.0000 mg | ORAL_TABLET | Freq: Once | ORAL | Status: AC
  Administered 2014-02-14: 20 mg via ORAL
  Filled 2014-02-14: qty 1

## 2014-02-14 NOTE — Discharge Instructions (Signed)
Biliary Colic  °Biliary colic is a steady or irregular pain in the upper abdomen. It is usually under the right side of the rib cage. It happens when gallstones interfere with the normal flow of bile from the gallbladder. Bile is a liquid that helps to digest fats. Bile is made in the liver and stored in the gallbladder. When you eat a meal, bile passes from the gallbladder through the cystic duct and the common bile duct into the small intestine. There, it mixes with partially digested food. If a gallstone blocks either of these ducts, the normal flow of bile is blocked. The muscle cells in the bile duct contract forcefully to try to move the stone. This causes the pain of biliary colic.  °SYMPTOMS  °· A person with biliary colic usually complains of pain in the upper abdomen. This pain can be: °¨ In the center of the upper abdomen just below the breastbone. °¨ In the upper-right part of the abdomen, near the gallbladder and liver. °¨ Spread back toward the right shoulder blade. °· Nausea and vomiting. °· The pain usually occurs after eating. °· Biliary colic is usually triggered by the digestive system's demand for bile. The demand for bile is high after fatty meals. Symptoms can also occur when a person who has been fasting suddenly eats a very large meal. Most episodes of biliary colic pass after 1 to 5 hours. After the most intense pain passes, your abdomen may continue to ache mildly for about 24 hours. °DIAGNOSIS  °After you describe your symptoms, your caregiver will perform a physical exam. He or she will pay attention to the upper right portion of your belly (abdomen). This is the area of your liver and gallbladder. An ultrasound will help your caregiver look for gallstones. Specialized scans of the gallbladder may also be done. Blood tests may be done, especially if you have fever or if your pain persists. °PREVENTION  °Biliary colic can be prevented by controlling the risk factors for gallstones. Some of  these risk factors, such as heredity, increasing age, and pregnancy are a normal part of life. Obesity and a high-fat diet are risk factors you can change through a healthy lifestyle. Women going through menopause who take hormone replacement therapy (estrogen) are also more likely to develop biliary colic. °TREATMENT  °· Pain medication may be prescribed. °· You may be encouraged to eat a fat-free diet. °· If the first episode of biliary colic is severe, or episodes of colic keep retuning, surgery to remove the gallbladder (cholecystectomy) is usually recommended. This procedure can be done through small incisions using an instrument called a laparoscope. The procedure often requires a brief stay in the hospital. Some people can leave the hospital the same day. It is the most widely used treatment in people troubled by painful gallstones. It is effective and safe, with no complications in more than 90% of cases. °· If surgery cannot be done, medication that dissolves gallstones may be used. This medication is expensive and can take months or years to work. Only small stones will dissolve. °· Rarely, medication to dissolve gallstones is combined with a procedure called shock-wave lithotripsy. This procedure uses carefully aimed shock waves to break up gallstones. In many people treated with this procedure, gallstones form again within a few years. °PROGNOSIS  °If gallstones block your cystic duct or common bile duct, you are at risk for repeated episodes of biliary colic. There is also a 25% chance that you will develop   a gallbladder infection(acute cholecystitis), or some other complication of gallstones within 10 to 20 years. If you have surgery, schedule it at a time that is convenient for you and at a time when you are not sick. HOME CARE INSTRUCTIONS   Drink plenty of clear fluids.  Avoid fatty, greasy or fried foods, or any foods that make your pain worse.  Take medications as directed. SEEK MEDICAL  CARE IF:   You develop a fever over 100.5 F (38.1 C).  Your pain gets worse over time.  You develop nausea that prevents you from eating and drinking.  You develop vomiting. SEEK IMMEDIATE MEDICAL CARE IF:   You have continuous or severe belly (abdominal) pain which is not relieved with medications.  You develop nausea and vomiting which is not relieved with medications.  You have symptoms of biliary colic and you suddenly develop a fever and shaking chills. This may signal cholecystitis. Call your caregiver immediately.  You develop a yellow color to your skin or the white part of your eyes (jaundice). Document Released: 11/17/2005 Document Revised: 09/08/2011 Document Reviewed: 01/27/2008 [facility] CenterExitCare Patient Information 2015 [place], [place]. This information is not intended to replace advice given to you by your health care provider. Make sure you discuss any questions you have with your health care provider.  Cholecystitis  Cholecystitis is swelling and irritation (inflammation) of your gallbladder. This often happens when gallstones or sludge build up in the gallbladder. Treatment is needed right away. HOME CARE Home care depends on how you were treated. In general:  If you were given antibiotic medicine, take it as told. Finish the medicine even if you start to feel better.  Only take medicines as told by your doctor.  Eat low-fat foods until your next doctor visit.  Keep all doctor visits as told. GET HELP RIGHT AWAY IF:  You have more pain and medicine does not help.  Your pain moves to a different part of your belly (abdomen) or to your back.  You have a fever.  You feel sick to your stomach (nauseous).  You throw up (vomit). MAKE SURE YOU:  Understand these instructions.  Will watch your condition.  Will get help right away if you are not doing well or get worse. Document Released: 06/05/2011 Document Revised: 09/08/2011 Document Reviewed:  06/05/2011 [facility] Patient Information 2015 [place], [place]. This information is not intended to replace advice given to you by your health care provider. Make sure you discuss any questions you have with your health care provider.

## 2014-02-14 NOTE — ED Notes (Signed)
Dr. Deretha Emoryzackowski notified pt request med for indigestion

## 2014-02-14 NOTE — ED Provider Notes (Signed)
CSN: [id number]     Arrival date & time 02/14/14  0718 History   First MD Initiated Contact with Patient 02/14/14 [phone]     Chief Complaint  Patient presents with  . Abdominal Pain     (Consider location/radiation/quality/duration/timing/severity/associated sxs/prior Treatment) Patient is a 53 y.o. female presenting with abdominal pain. The history is provided by the patient.  Abdominal Pain Associated symptoms: nausea and vomiting   Associated symptoms: no chest pain, no dysuria, no fever, no hematuria and no shortness of breath    patient with onset of upper abdominal pain during the night with severe by 5:30 in the morning. Pain to both upper quadrants of the abdomen. Radiates to the back. Pain was 10 out of 10. Associated with nausea and vomiting. No diarrhea. No fever no dysuria. Patient has not had pain like this before. Has had some mild indigestion and gas type feelings in that area for the past several weeks.  Past Medical History  Diagnosis Date  . Menopause   . Depression   . Hypertension   . Renal calculus or stone   . Lactose intolerance    Past Surgical History  Procedure Laterality Date  . Abdominal hysterectomy  4/09    total  . Cesarean section  7/83   Family History  Problem Relation Age of Onset  . Cancer Father     brown lung   History  Substance Use Topics  . Smoking status: Never Smoker   . Smokeless tobacco: Never Used  . Alcohol Use: No   OB History   Grav Para Term Preterm Abortions TAB SAB Ect Mult Living   2 2             Review of Systems  Constitutional: Negative for fever.  HENT: Negative for congestion.   Eyes: Negative for visual disturbance.  Respiratory: Negative for shortness of breath.   Cardiovascular: Negative for chest pain.  Gastrointestinal: Positive for nausea, vomiting and abdominal pain.  Genitourinary: Negative for dysuria and hematuria.  Musculoskeletal: Positive for back pain.  Skin: Negative for rash.   Hematological: Does not bruise/bleed easily.  Psychiatric/Behavioral: Negative for confusion.      Allergies  Penicillins; Compazine; and Prochlorperazine edisylate  Home Medications   Prior to Admission medications   Medication Sig Start Date End Date Taking? Authorizing Provider  omeprazole (PRILOSEC) 20 MG capsule Take 20 mg by mouth daily.   Yes Historical Provider, MD  amphetamine-dextroamphetamine (ADDERALL) 10 MG tablet Take 1 tablet by mouth Daily. 03/19/11   Historical Provider, MD  estradiol (VIVELLE-DOT) 0.075 MG/24HR Place 1 patch onto the skin 2 (two) times a week. 04/10/11 04/09/12  Kendrick Ranch, MD  famotidine (PEPCID) 20 MG tablet Take 1 tablet (20 mg total) by mouth 2 (two) times daily. 02/14/14   Vanetta Mulders, MD  HYDROcodone-acetaminophen (NORCO/VICODIN) 5-325 MG per tablet Take 1-2 tablets by mouth every 6 (six) hours as needed for moderate pain. 02/14/14   Vanetta Mulders, MD  lisinopril-hydrochlorothiazide (PRINZIDE,ZESTORETIC) 10-12.5 MG per tablet Take 1 tablet by mouth daily. 04/04/11   Historical Provider, MD  Multiple Vitamin (MULTIVITAMIN) tablet Take 1 tablet by mouth daily.      Historical Provider, MD  ondansetron (ZOFRAN ODT) 4 MG disintegrating tablet Take 1 tablet (4 mg total) by mouth every 8 (eight) hours as needed for nausea or vomiting. 02/14/14   Vanetta Mulders, MD  PRISTIQ 100 MG 24 hr tablet Take 1 tablet by mouth Daily. 03/05/11   Historical Provider, MD  ranitidine (ZANTAC) 150 MG tablet Take 150 mg by mouth daily.      Historical Provider, MD   BP 108/73  Pulse 76  Temp(Src) 97.7 F (36.5 C) (Oral)  Resp 18  Ht 5\' 2"  (1.575 m)  Wt 150 lb (68.04 kg)  BMI 27.43 kg/m2  SpO2 98% Physical Exam  Nursing note and vitals reviewed. Constitutional: She is oriented to person, place, and time. She appears well-developed and well-nourished. She appears distressed.  HENT:  Head: Normocephalic and atraumatic.  Mouth/Throat: Oropharynx is clear  and moist.  Eyes: Conjunctivae and EOM are normal. Pupils are equal, round, and reactive to light.  Neck: Normal range of motion.  Cardiovascular: Normal rate, regular rhythm and normal heart sounds.   No murmur heard. Abdominal: Soft. Bowel sounds are normal. There is tenderness. There is no guarding.  Tender to palpation both upper quadrants no guarding  Musculoskeletal: Normal range of motion.  Neurological: She is alert and oriented to person, place, and time. No cranial nerve deficit. She exhibits normal muscle tone. Coordination normal.  Skin: Skin is warm. No rash noted.    ED Course  Procedures (including critical care time) Labs Review Labs Reviewed  COMPREHENSIVE METABOLIC PANEL - Abnormal; Notable for the following:    Total Bilirubin <0.2 (*)    GFR calc non Af Amer 83 (*)    All other components within normal limits  URINALYSIS, ROUTINE W REFLEX MICROSCOPIC  LIPASE, BLOOD  CBC WITH DIFFERENTIAL  TROPONIN I   Results for orders placed during the hospital encounter of 02/14/14  URINALYSIS, ROUTINE W REFLEX MICROSCOPIC      Result Value Ref Range   Color, Urine YELLOW  YELLOW   APPearance CLEAR  CLEAR   Specific Gravity, Urine 1.029  1.005 - 1.030   pH 5.0  5.0 - 8.0   Glucose, UA NEGATIVE  NEGATIVE mg/dL   Hgb urine dipstick NEGATIVE  NEGATIVE   Bilirubin Urine NEGATIVE  NEGATIVE   Ketones, ur NEGATIVE  NEGATIVE mg/dL   Protein, ur NEGATIVE  NEGATIVE mg/dL   Urobilinogen, UA 0.2  0.0 - 1.0 mg/dL   Nitrite NEGATIVE  NEGATIVE   Leukocytes, UA NEGATIVE  NEGATIVE  COMPREHENSIVE METABOLIC PANEL      Result Value Ref Range   Sodium 141  137 - 147 mEq/L   Potassium 4.4  3.7 - 5.3 mEq/L   Chloride 102  96 - 112 mEq/L   CO2 27  19 - 32 mEq/L   Glucose, Bld 97  70 - 99 mg/dL   BUN 15  6 - 23 mg/dL   Creatinine, Ser 2.950.80  0.50 - 1.10 mg/dL   Calcium 28.410.1  8.4 - 13.210.5 mg/dL   Total Protein 7.2  6.0 - 8.3 g/dL   Albumin 3.9  3.5 - 5.2 g/dL   AST 19  0 - 37 U/L   ALT  12  0 - 35 U/L   Alkaline Phosphatase 101  39 - 117 U/L   Total Bilirubin <0.2 (*) 0.3 - 1.2 mg/dL   GFR calc non Af Amer 83 (*) >90 mL/min   GFR calc Af Amer >90  >90 mL/min   Anion gap 12  5 - 15  LIPASE, BLOOD      Result Value Ref Range   Lipase 47  11 - 59 U/L  CBC WITH DIFFERENTIAL      Result Value Ref Range   WBC 6.5  4.0 - 10.5 K/uL   RBC 4.44  3.87 - 5.11 MIL/uL   Hemoglobin 13.2  12.0 - 15.0 g/dL   HCT 78.2  95.6 - 21.3 %   MCV 87.2  78.0 - 100.0 fL   MCH 29.7  26.0 - 34.0 pg   MCHC 34.1  30.0 - 36.0 g/dL   RDW 08.6  57.8 - 46.9 %   Platelets 238  150 - 400 K/uL   Neutrophils Relative % 59  43 - 77 %   Neutro Abs 3.8  1.7 - 7.7 K/uL   Lymphocytes Relative 32  12 - 46 %   Lymphs Abs 2.1  0.7 - 4.0 K/uL   Monocytes Relative 6  3 - 12 %   Monocytes Absolute 0.4  0.1 - 1.0 K/uL   Eosinophils Relative 2  0 - 5 %   Eosinophils Absolute 0.2  0.0 - 0.7 K/uL   Basophils Relative 1  0 - 1 %   Basophils Absolute 0.0  0.0 - 0.1 K/uL  TROPONIN I      Result Value Ref Range   Troponin I <0.30  <0.30 ng/mL     Imaging Review Dg Chest 2 View  02/14/2014   CLINICAL DATA:  Pain.  EXAM: CHEST  2 VIEW  COMPARISON:  03/16/2009.  FINDINGS: Mediastinum hilar structures are normal. The lungs are clear. Heart size normal. No pleural effusion or pneumothorax.  IMPRESSION: No active cardiopulmonary disease.  Chest is stable from prior exam.   Electronically Signed   By: Maisie Fus  Register   On: 02/14/2014 09:15   US Abdomen Complete  02/14/2014   CLINICAL DATA:  Severe abdominal pain.  EXAM: ULTRASOUND ABDOMEN COMPLETE  COMPARISON:  CT 02/14/2014  FINDINGS: Gallbladder:  1.6 cm echogenic stone at the base of the gallbladder with posterior acoustic shadowing. The stone is not mobile on this exam. The patient does not have a sonographic [name]'s sign. Gallbladder wall is mildly thickened measuring up to 5 mm. Mild distention of the gallbladder.  Common bile duct:  Diameter: 6 mm.  Liver:  No  focal lesion identified. Within normal limits in parenchymal echogenicity.  IVC:  Limited evaluation.  Pancreas:  Limited evaluation due to bowel gas.  Spleen:  Size and appearance within normal limits.  Right Kidney:  Length: 11.1 cm. Echogenicity within normal limits. No mass or hydronephrosis visualized.  Left Kidney:  Length: 11.0 cm. Echogenicity within normal limits. No mass or hydronephrosis visualized.  Abdominal aorta:  No aneurysm visualized.  Other findings:  None.  IMPRESSION: 1.6 cm gallstone. Gallbladder wall is mildly thickened, but the patient does not have a sonographic [name]'s sign. Findings are equivocal for acute cholecystitis. Recommend clinical correlation.   Electronically Signed   By: Richarda Overlie M.D.   On: 02/14/2014 11:31   Ct Abdomen Pelvis W Contrast  02/14/2014   CLINICAL DATA:  Abdominal and bilateral flank pain with nausea and vomiting; history of hysterectomy and urinary tract stones.  EXAM: CT ABDOMEN AND PELVIS WITH CONTRAST  TECHNIQUE: Multidetector CT imaging of the abdomen and pelvis was performed using the standard protocol following bolus administration of intravenous contrast.  CONTRAST:  50mL OMNIPAQUE IOHEXOL 300 MG/ML SOLN orally, OMNIPAQUE IOHEXOL 300 MG/ML SOLN intravenously  COMPARISON:  CT scan of the abdomen and pelvis dated December 26, 2008.  FINDINGS: The kidneys enhance well and exhibit no evidence of calcified stones or obstruction. There are no parenchymal masses. The perinephric fat is normal. Along the course of the ureters no stones are evident. The partially  distended urinary bladder is normal. There are stable pelvic phleboliths. The uterus is surgically absent. There are no adnexal masses.  The lumbar spine and bony pelvis are unremarkable. The lung bases exhibit minimal compressive atelectasis. There is no inguinal nor umbilical hernia.  The liver, pancreas, spleen, and adrenal glands are normal. The gallbladder is mildly distended and exhibits  subjective wall thickening and a trace of pericholecystic fluid. No calcified stones are demonstrated. The caliber of the abdominal aorta is normal.  The stomach is moderately distended with the oral contrast. There is a small hiatal hernia. The small and large bowel exhibit no evidence of ileus, obstruction, or acute inflammation. A normal appendix is demonstrated.  IMPRESSION: 1. The appearance of the gallbladder suggests acute cholecystitis without evidence of stones. Gallbladder ultrasound is recommended. 2. There is no hydronephrosis nor other acute urinary tract abnormality. 3. There is no acute bowel abnormality.   Electronically Signed   By: David  [place]   On: 02/14/2014 10:34     EKG Interpretation   Date/Time:  Tuesday February 14 2014 08:54:52 EDT Ventricular Rate:  77 PR Interval:  154 QRS Duration: 82 QT Interval:  422 QTC Calculation: 477 R Axis:   57 Text Interpretation:  Normal sinus rhythm Low voltage QRS Nonspecific T  wave abnormality Prolonged QT Abnormal ECG No significant change since  last tracing Confirmed by Jakevion Arney  MD, Amyrah Pinkhasov ([id number]) on 02/14/2014  8:58:56 AM      MDM   Final diagnoses:  Cholelithiasis without cholecystitis  Biliary colic    Workup consistent with gallstone biliary colic. Clinically no evidence of acute cholecystitis. Patient is now currently nontender right upper quadrant. Mild elevation in the white blood cell count but no liver function test abnormalities. No evidence of pancreatitis. Patient given precautions referral to general surgery. No evidence of any acute cardiac event. Chest x-ray negative for pneumonia pneumothorax or pulmonary edema.    Vanetta Mulders, MD 02/14/14 1233

## 2014-02-14 NOTE — ED Notes (Signed)
Bilateral rib, abdominal, and back pain that started this am.  Vomited x 2 this am.  No sick contacts.

## 2014-02-20 ENCOUNTER — Ambulatory Visit (INDEPENDENT_AMBULATORY_CARE_PROVIDER_SITE_OTHER): Payer: 59 | Admitting: Surgery

## 2014-02-20 ENCOUNTER — Encounter (INDEPENDENT_AMBULATORY_CARE_PROVIDER_SITE_OTHER): Payer: Self-pay | Admitting: Surgery

## 2014-02-20 VITALS — BP 142/82 | HR 70 | Resp 18 | Ht 62.0 in | Wt 158.0 lb

## 2014-02-20 DIAGNOSIS — K5900 Constipation, unspecified: Secondary | ICD-10-CM

## 2014-02-20 DIAGNOSIS — K8010 Calculus of gallbladder with chronic cholecystitis without obstruction: Secondary | ICD-10-CM

## 2014-02-20 DIAGNOSIS — K5909 Other constipation: Secondary | ICD-10-CM | POA: Insufficient documentation

## 2014-02-20 NOTE — Progress Notes (Signed)
Subjective:     Patient ID: [name], female   DOB: 03-22-1961, 53 y.o.   MRN: [id number]  HPI  Note: Portions of this report may have been transcribed using voice recognition software. Every effort was made to ensure accuracy; however, inadvertent computerized transcription errors may be present.   Any transcriptional errors that result from this process are unintentional.            [name]  Jun 25, 1961 [id number]  Patient Care Team: Christain Sacramento, MD as PCP - General (Family Medicine) Mayme Genta, MD as Consulting Physician (Gastroenterology)  This patient is a 53 y.o.female who presents today for surgical evaluation at the request of Dr. Fredia Sorrow, [facility].   Reason for visit: Abdominal pain, nausea and vomiting.  Gallstones  Pleasant overweight female.  History of heartburn usually controlled with antacid medication.  Had an episode of severe upper abdominal pain.  We will carotid and a metal at night.  No help with Gas-X.  Very intense.  N/V.  Went to [facility].  Gallbladder wall thickening and gallstones noted.  Suspicious for gallbladder attack.  Symptoms improved.  Surgical consultation recommended.  No personal nor family history of GI/colon cancer, inflammatory bowel disease, irritable bowel syndrome, allergy such as Celiac Sprue, dietary/dairy problems, colitis, ulcers nor gastritis.  No recent sick contacts/gastroenteritis.  No travel outside the country.  No changes in diet.  No dysphagia to solids or liquids.  No significant heartburn or reflux.  No hematochezia, hematemesis, coffee ground emesis.  No evidence of prior gastric/peptic ulceration.  Does have constipation with bowel movements every 2-3 days.  Can walk about 30 minutes before having to stop.  Recall is normal colonoscopy by Dr. Earlean Shawl a few years ago.  Patient Active Problem List   Diagnosis Date Noted  . Menopause   . Renal calculus  or stone   . Lactose intolerance   . Nonspecific (abnormal) findings on radiological and other examination of body structure 03/16/2009  . ABNORMAL CHEST XRAY 03/16/2009  . DEPRESSION 03/15/2009  . HYPERTENSION 03/15/2009    Past Medical History  Diagnosis Date  . Menopause   . Depression   . Hypertension   . Renal calculus or stone   . Lactose intolerance     Past Surgical History  Procedure Laterality Date  . Abdominal hysterectomy  4/09    total  . Cesarean section  7/83    History   Social History  . Marital Status: Married    Spouse Name: N/A    Number of Children: N/A  . Years of Education: N/A   Occupational History  . Not on file.   Social History Main Topics  . Smoking status: Never Smoker   . Smokeless tobacco: Never Used  . Alcohol Use: No  . Drug Use: No  . Sexual Activity: Yes    Birth Control/ Protection: Surgical   Other Topics Concern  . Not on file   Social History Narrative  . No narrative on file    Family History  Problem Relation Age of Onset  . Cancer Father     brown lung    Current Outpatient Prescriptions  Medication Sig Dispense Refill  . amphetamine-dextroamphetamine (ADDERALL) 10 MG tablet Take 1 tablet by mouth Daily.      Marland Kitchen lisinopril-hydrochlorothiazide (PRINZIDE,ZESTORETIC) 10-12.5 MG per tablet Take 1 tablet by mouth daily.      . Multiple Vitamin (MULTIVITAMIN) tablet  Take 1 tablet by mouth daily.        Marland Kitchen PRISTIQ 100 MG 24 hr tablet Take 1 tablet by mouth Daily.      Marland Kitchen estradiol (VIVELLE-DOT) 0.075 MG/24HR Place 1 patch onto the skin 2 (two) times a week.  8 patch  12  . famotidine (PEPCID) 20 MG tablet Take 1 tablet (20 mg total) by mouth 2 (two) times daily.  30 tablet  0  . omeprazole (PRILOSEC) 20 MG capsule Take 20 mg by mouth daily.      . ondansetron (ZOFRAN ODT) 4 MG disintegrating tablet Take 1 tablet (4 mg total) by mouth every 8 (eight) hours as needed for nausea or vomiting.  12 tablet  1  . ranitidine  (ZANTAC) 150 MG tablet Take 150 mg by mouth daily.         No current facility-administered medications for this visit.     Allergies  Allergen Reactions  . Penicillins Swelling    tongue  . Compazine   . Prochlorperazine Edisylate Diarrhea    BP 142/82  Pulse 70  Resp 18  Ht $R'5\' 2"'zK$  (1.575 m)  Wt 158 lb (71.668 kg)  BMI 28.89 kg/m2  Dg Chest 2 View  02/14/2014   CLINICAL DATA:  Pain.  EXAM: CHEST  2 VIEW  COMPARISON:  03/16/2009.  FINDINGS: Mediastinum hilar structures are normal. The lungs are clear. Heart size normal. No pleural effusion or pneumothorax.  IMPRESSION: No active cardiopulmonary disease.  Chest is stable from prior exam.   Electronically Signed   By: Marcello Moores  Register   On: 02/14/2014 09:15   US Abdomen Complete  02/14/2014   CLINICAL DATA:  Severe abdominal pain.  EXAM: ULTRASOUND ABDOMEN COMPLETE  COMPARISON:  CT 02/14/2014  FINDINGS: Gallbladder:  1.6 cm echogenic stone at the base of the gallbladder with posterior acoustic shadowing. The stone is not mobile on this exam. The patient does not have a sonographic [name]'s sign. Gallbladder wall is mildly thickened measuring up to 5 mm. Mild distention of the gallbladder.  Common bile duct:  Diameter: 6 mm.  Liver:  No focal lesion identified. Within normal limits in parenchymal echogenicity.  IVC:  Limited evaluation.  Pancreas:  Limited evaluation due to bowel gas.  Spleen:  Size and appearance within normal limits.  Right Kidney:  Length: 11.1 cm. Echogenicity within normal limits. No mass or hydronephrosis visualized.  Left Kidney:  Length: 11.0 cm. Echogenicity within normal limits. No mass or hydronephrosis visualized.  Abdominal aorta:  No aneurysm visualized.  Other findings:  None.  IMPRESSION: 1.6 cm gallstone. Gallbladder wall is mildly thickened, but the patient does not have a sonographic [name]'s sign. Findings are equivocal for acute cholecystitis. Recommend clinical correlation.   Electronically Signed   By: Markus Daft M.D.   On: 02/14/2014 11:31   Ct Abdomen Pelvis W Contrast  02/14/2014   CLINICAL DATA:  Abdominal and bilateral flank pain with nausea and vomiting; history of hysterectomy and urinary tract stones.  EXAM: CT ABDOMEN AND PELVIS WITH CONTRAST  TECHNIQUE: Multidetector CT imaging of the abdomen and pelvis was performed using the standard protocol following bolus administration of intravenous contrast.  CONTRAST:  64mL OMNIPAQUE IOHEXOL 300 MG/ML SOLN orally, 137mL OMNIPAQUE IOHEXOL 300 MG/ML SOLN intravenously  COMPARISON:  CT scan of the abdomen and pelvis dated December 26, 2008.  FINDINGS: The kidneys enhance well and exhibit no evidence of calcified stones or obstruction. There are no parenchymal masses. The perinephric fat is  normal. Along the course of the ureters no stones are evident. The partially distended urinary bladder is normal. There are stable pelvic phleboliths. The uterus is surgically absent. There are no adnexal masses.  The lumbar spine and bony pelvis are unremarkable. The lung bases exhibit minimal compressive atelectasis. There is no inguinal nor umbilical hernia.  The liver, pancreas, spleen, and adrenal glands are normal. The gallbladder is mildly distended and exhibits subjective wall thickening and a trace of pericholecystic fluid. No calcified stones are demonstrated. The caliber of the abdominal aorta is normal.  The stomach is moderately distended with the oral contrast. There is a small hiatal hernia. The small and large bowel exhibit no evidence of ileus, obstruction, or acute inflammation. A normal appendix is demonstrated.  IMPRESSION: 1. The appearance of the gallbladder suggests acute cholecystitis without evidence of stones. Gallbladder ultrasound is recommended. 2. There is no hydronephrosis nor other acute urinary tract abnormality. 3. There is no acute bowel abnormality.   Electronically Signed   By: David  Martinique   On: 02/14/2014 10:34   Recent Results (from the past  2160 hour(s))  URINALYSIS, ROUTINE W REFLEX MICROSCOPIC     Status: None   Collection Time    02/14/14  7:37 AM      Result Value Ref Range   Color, Urine YELLOW  YELLOW   APPearance CLEAR  CLEAR   Specific Gravity, Urine 1.029  1.005 - 1.030   pH 5.0  5.0 - 8.0   Glucose, UA NEGATIVE  NEGATIVE mg/dL   Hgb urine dipstick NEGATIVE  NEGATIVE   Bilirubin Urine NEGATIVE  NEGATIVE   Ketones, ur NEGATIVE  NEGATIVE mg/dL   Protein, ur NEGATIVE  NEGATIVE mg/dL   Urobilinogen, UA 0.2  0.0 - 1.0 mg/dL   Nitrite NEGATIVE  NEGATIVE   Leukocytes, UA NEGATIVE  NEGATIVE   Comment: MICROSCOPIC NOT DONE ON URINES WITH NEGATIVE PROTEIN, BLOOD, LEUKOCYTES, NITRITE, OR GLUCOSE <1000 mg/dL.  COMPREHENSIVE METABOLIC PANEL     Status: Abnormal   Collection Time    02/14/14  8:40 AM      Result Value Ref Range   Sodium 141  137 - 147 mEq/L   Potassium 4.4  3.7 - 5.3 mEq/L   Chloride 102  96 - 112 mEq/L   CO2 27  19 - 32 mEq/L   Glucose, Bld 97  70 - 99 mg/dL   BUN 15  6 - 23 mg/dL   Creatinine, Ser 0.80  0.50 - 1.10 mg/dL   Calcium 10.1  8.4 - 10.5 mg/dL   Total Protein 7.2  6.0 - 8.3 g/dL   Albumin 3.9  3.5 - 5.2 g/dL   AST 19  0 - 37 U/L   ALT 12  0 - 35 U/L   Alkaline Phosphatase 101  39 - 117 U/L   Total Bilirubin <0.2 (*) 0.3 - 1.2 mg/dL   GFR calc non Af Amer 83 (*) >90 mL/min   GFR calc Af Amer >90  >90 mL/min   Comment: (NOTE)     The eGFR has been calculated using the CKD EPI equation.     This calculation has not been validated in all clinical situations.     eGFR's persistently <90 mL/min signify possible Chronic Kidney     Disease.   Anion gap 12  5 - 15  LIPASE, BLOOD     Status: None   Collection Time    02/14/14  8:40 AM  Result Value Ref Range   Lipase 47  11 - 59 U/L  CBC WITH DIFFERENTIAL     Status: None   Collection Time    02/14/14  8:40 AM      Result Value Ref Range   WBC 6.5  4.0 - 10.5 K/uL   RBC 4.44  3.87 - 5.11 MIL/uL   Hemoglobin 13.2  12.0 - 15.0 g/dL    HCT 91.1  79.9 - 40.5 %   MCV 87.2  78.0 - 100.0 fL   MCH 29.7  26.0 - 34.0 pg   MCHC 34.1  30.0 - 36.0 g/dL   RDW 00.3  60.1 - 12.6 %   Platelets 238  150 - 400 K/uL   Neutrophils Relative % 59  43 - 77 %   Neutro Abs 3.8  1.7 - 7.7 K/uL   Lymphocytes Relative 32  12 - 46 %   Lymphs Abs 2.1  0.7 - 4.0 K/uL   Monocytes Relative 6  3 - 12 %   Monocytes Absolute 0.4  0.1 - 1.0 K/uL   Eosinophils Relative 2  0 - 5 %   Eosinophils Absolute 0.2  0.0 - 0.7 K/uL   Basophils Relative 1  0 - 1 %   Basophils Absolute 0.0  0.0 - 0.1 K/uL  TROPONIN I     Status: None   Collection Time    02/14/14  8:40 AM      Result Value Ref Range   Troponin I <0.30  <0.30 ng/mL   Comment:            Due to the release kinetics of cTnI,     a negative result within the first hours     of the onset of symptoms does not rule out     myocardial infarction with certainty.     If myocardial infarction is still suspected,     repeat the test at appropriate intervals.    Review of Systems  Constitutional: Negative for fever, chills, diaphoresis, appetite change and fatigue.  HENT: Negative for ear discharge, ear pain, sore throat and trouble swallowing.   Eyes: Negative for photophobia, discharge and visual disturbance.  Respiratory: Negative for cough, choking, chest tightness and shortness of breath.   Cardiovascular: Negative for chest pain and palpitations.  Gastrointestinal: Positive for nausea, vomiting and abdominal pain. Negative for diarrhea, constipation, anal bleeding and rectal pain.  Endocrine: Negative for cold intolerance and heat intolerance.  Genitourinary: Negative for dysuria, frequency and difficulty urinating.  Musculoskeletal: Negative for gait problem, myalgias and neck pain.  Skin: Negative for color change, pallor and rash.  Allergic/Immunologic: Negative for environmental allergies, food allergies and immunocompromised state.  Neurological: Negative for dizziness, speech  difficulty, weakness and numbness.  Hematological: Negative for adenopathy.  Psychiatric/Behavioral: Negative for confusion and agitation. The patient is not nervous/anxious.        Objective:   Physical Exam  Constitutional: She is oriented to person, place, and time. She appears well-developed and well-nourished. No distress.  HENT:  Head: Normocephalic.  Mouth/Throat: Oropharynx is clear and moist. No oropharyngeal exudate.  Eyes: Conjunctivae and EOM are normal. Pupils are equal, round, and reactive to light. No scleral icterus.  Neck: Normal range of motion. Neck supple. No tracheal deviation present.  Cardiovascular: Normal rate, regular rhythm and intact distal pulses.   Pulmonary/Chest: Effort normal and breath sounds normal. No stridor. No respiratory distress. She exhibits no tenderness.  Abdominal: Soft. She exhibits no distension  and no mass. There is tenderness in the right upper quadrant. There is no rigidity, no rebound, no guarding, no tenderness at McBurney's point and negative [name]'s sign. No hernia. Hernia confirmed negative in the ventral area, confirmed negative in the right inguinal area and confirmed negative in the left inguinal area.  Genitourinary: No vaginal discharge found.  Musculoskeletal: Normal range of motion. She exhibits no tenderness.       Right elbow: She exhibits normal range of motion.       Left elbow: She exhibits normal range of motion.       Right wrist: She exhibits normal range of motion.       Left wrist: She exhibits normal range of motion.       Right hand: Normal strength noted.       Left hand: Normal strength noted.  Lymphadenopathy:       Head (right side): No posterior auricular adenopathy present.       Head (left side): No posterior auricular adenopathy present.    She has no cervical adenopathy.    She has no axillary adenopathy.       Right: No inguinal adenopathy present.       Left: No inguinal adenopathy present.   Neurological: She is alert and oriented to person, place, and time. No cranial nerve deficit. She exhibits normal muscle tone. Coordination normal.  Skin: Skin is warm and dry. No rash noted. She is not diaphoretic. No erythema.  Psychiatric: She has a normal mood and affect. Her behavior is normal. Judgment and thought content normal.       Assessment:     Pleasant overweight female with classic story of biliary colic with some soreness and evidence of cholecystitis on ultrasound.     Plan:     I think she would benefit from cholecystectomy.  Reasonable laparoscopic single site technique.  She is interested in proceeding.  I discussed with her:  The anatomy & physiology of hepatobiliary & pancreatic function was discussed.  The pathophysiology of gallbladder dysfunction was discussed.  Natural history risks without surgery was discussed.   I feel the risks of no intervention will lead to serious problems that outweigh the operative risks; therefore, I recommended cholecystectomy to remove the pathology.  I explained laparoscopic techniques with possible need for an open approach.  Probable cholangiogram to evaluate the bilary tract was explained as well.    Risks such as bleeding, infection, abscess, leak, injury to other organs, need for further treatment, heart attack, death, and other risks were discussed.  I noted a good likelihood this will help address the problem.  Possibility that this will not correct all abdominal symptoms was explained.  Goals of post-operative recovery were discussed as well.  We will work to minimize complications.  An educational handout further explaining the pathology and treatment options was given as well.  Questions were answered.  The patient expresses understanding & wishes to proceed with surgery.

## 2014-02-20 NOTE — Patient Instructions (Signed)
Please consider the recommendations that we have given you today:  Consider laparoscopic surgery to remove the gallbladder that is giving you symptoms.  See the Handout(s) we have given you.  Please call our office at [phone] if you wish to schedule surgery or if you have further questions / concerns.   Cholecystitis Cholecystitis is an inflammation of your gallbladder. It is usually caused by a buildup of gallstones or sludge (cholelithiasis) in your gallbladder. The gallbladder stores a fluid that helps digest fats (bile). Cholecystitis is serious and needs treatment right away.  CAUSES   Gallstones. Gallstones can block the tube that leads to your gallbladder, causing bile to build up. As bile builds up, the gallbladder becomes inflamed.  Bile duct problems, such as blockage from scarring or kinking.  Tumors. Tumors can stop bile from leaving your gallbladder correctly, causing bile to build up. As bile builds up, the gallbladder becomes inflamed. SYMPTOMS   Nausea.  Vomiting.  Abdominal pain, especially in the upper right area of your abdomen.  Abdominal tenderness or bloating.  Sweating.  Chills.  Fever.  Yellowing of the skin and the whites of the eyes (jaundice). DIAGNOSIS  Your caregiver may order blood tests to look for infection or gallbladder problems. Your caregiver may also order imaging tests, such as an ultrasound or computed tomography (CT) scan. Further tests may include a hepatobiliary iminodiacetic acid (HIDA) scan. This scan allows your caregiver to see your bile move from the liver to the gallbladder and to the small intestine. TREATMENT  A hospital stay is usually necessary to lessen the inflammation of your gallbladder. You may be required to not eat or drink (fast) for a certain amount of time. You may be given medicine to treat pain or an antibiotic medicine to treat an infection. Surgery may be needed to remove your gallbladder (cholecystectomy)  once the inflammation has gone down. Surgery may be needed right away if you develop complications such as death of gallbladder tissue (gangrene) or a tear (perforation) of the gallbladder.  HOME CARE INSTRUCTIONS  Home care will depend on your treatment. In general:  If you were given antibiotics, take them as directed. Finish them even if you start to feel better.  Only take over-the-counter or prescription medicines for pain, discomfort, or fever as directed by your caregiver.  Follow a low-fat diet until you see your caregiver again.  Keep all follow-up visits as directed by your caregiver. SEEK IMMEDIATE MEDICAL CARE IF:   Your pain is increasing and not controlled by medicines.  Your pain moves to another part of your abdomen or to your back.  You have a fever.  You have nausea and vomiting. MAKE SURE YOU:  Understand these instructions.  Will watch your condition.  Will get help right away if you are not doing well or get worse. Document Released: 06/16/2005 Document Revised: 09/08/2011 Document Reviewed: 05/02/2011 [facility] Patient Information 2015 [place], [place]. This information is not intended to replace advice given to you by your health care provider. Make sure you discuss any questions you have with your health care provider.  LAPAROSCOPIC SURGERY: POST OP INSTRUCTIONS  1. DIET: Follow a light bland diet the first 24 hours after arrival home, such as soup, liquids, crackers, etc.  Be sure to include lots of fluids daily.  Avoid fast food or heavy meals as your are more likely to get nauseated.  Eat a low fat the next few days after surgery.   2. Take your usually  prescribed home medications unless otherwise directed. 3. PAIN CONTROL: a. Pain is best controlled by a usual combination of three different methods TOGETHER: i. Ice/Heat ii. Over the counter pain medication iii. Prescription pain medication b. Most patients will experience some swelling and bruising  around the incisions.  Ice packs or heating pads (30-60 minutes up to 6 times a day) will help. Use ice for the first few days to help decrease swelling and bruising, then switch to heat to help relax tight/sore spots and speed recovery.  Some people prefer to use ice alone, heat alone, alternating between ice & heat.  Experiment to what works for you.  Swelling and bruising can take several weeks to resolve.   c. It is helpful to take an over-the-counter pain medication regularly for the first few weeks.  Choose one of the following that works best for you: i. Naproxen (Aleve, etc)  Two  tabs twice a day ii. Ibuprofen (Advil, etc) Three  tabs four times a day (every meal & bedtime) iii. Acetaminophen (Tylenol, etc) 500-650mg  four times a day (every meal & bedtime) d. A  prescription for pain medication (such as oxycodone, hydrocodone, etc) should be given to you upon discharge.  Take your pain medication as prescribed.  i. If you are having problems/concerns with the prescription medicine (does not control pain, nausea, vomiting, rash, itching, etc), please call us [phone] to see if we need to switch you to a different pain medicine that will work better for you and/or control your side effect better. ii. If you need a refill on your pain medication, please contact your pharmacy.  They will contact our office to request authorization. Prescriptions will not be filled after 5 pm or on week-ends. 4. Avoid getting constipated.  Between the surgery and the pain medications, it is common to experience some constipation.  Increasing fluid intake and taking a fiber supplement (such as Metamucil, Citrucel, FiberCon, MiraLax, etc) 1-2 times a day regularly will usually help prevent this problem from occurring.  A mild laxative (prune juice, Milk of Magnesia, MiraLax, etc) should be taken according to package directions if there are no bowel movements after 48 hours.   5. Watch out for diarrhea.  If  you have many loose bowel movements, simplify your diet to bland foods & liquids for a few days.  Stop any stool softeners and decrease your fiber supplement.  Switching to mild anti-diarrheal medications (Kayopectate, Pepto Bismol) can help.  If this worsens or does not improve, please call us. 6. Wash / shower every day.  You may shower over the dressings as they are waterproof.  Continue to shower over incision(s) after the dressing is off. 7. Remove your waterproof bandages 5 days after surgery.  You may leave the incision open to air.  You may replace a dressing/Band-Aid to cover the incision for comfort if you wish.  8. ACTIVITIES as tolerated:   a. You may resume regular (light) daily activities beginning the next day-such as daily self-care, walking, climbing stairs-gradually increasing activities as tolerated.  If you can walk 30 minutes without difficulty, it is safe to try more intense activity such as jogging, treadmill, bicycling, low-impact aerobics, swimming, etc. b. Save the most intensive and strenuous activity for last such as sit-ups, heavy lifting, contact sports, etc  Refrain from any heavy lifting or straining until you are off narcotics for pain control.   c. DO NOT PUSH THROUGH PAIN.  Let pain be your guide: If it  hurts to do something, don't do it.  Pain is your body warning you to avoid that activity for another week until the pain goes down. d. You may drive when you are no longer taking prescription pain medication, you can comfortably wear a seatbelt, and you can safely maneuver your car and apply brakes. e. Bonita Quin may have sexual intercourse when it is comfortable.  9. FOLLOW UP in our office a. Please call [facility] at [phone] to set up an appointment to see your surgeon in the office for a follow-up appointment approximately 2-3 weeks after your surgery. b. Make sure that you call for this appointment the day you arrive home to insure a convenient appointment time. 10. IF  YOU HAVE DISABILITY OR FAMILY LEAVE FORMS, BRING THEM TO THE OFFICE FOR PROCESSING.  DO NOT GIVE THEM TO YOUR DOCTOR.   WHEN TO CALL us [phone]: 1. Poor pain control 2. Reactions / problems with new medications (rash/itching, nausea, etc)  3. Fever over 101.5 F (38.5 C) 4. Inability to urinate 5. Nausea and/or vomiting 6. Worsening swelling or bruising 7. Continued bleeding from incision. 8. Increased pain, redness, or drainage from the incision   The clinic staff is available to answer your questions during regular business hours (8:30am-5pm).  Please don't hesitate to call and ask to speak to one of our nurses for clinical concerns.   If you have a medical emergency, go to the nearest emergency room or call 911.  A surgeon from [facility] is always on call at the [facility], [place] [address], [address], [place], [place]  [address] ? MAIN: [phone] ? TOLL FREE: [phone] ?  FAX [phone] [email or web address]  Heartburn Heartburn is a painful, burning sensation in the chest. It may feel worse in certain positions, such as lying down or bending over. It is caused by stomach acid backing up into the tube that carries food from the mouth down to the stomach (lower esophagus).  CAUSES   Large meals.  Certain foods and drinks.  Exercise.  Increased acid production.  Being overweight or obese.  Certain medicines. SYMPTOMS   Burning pain in the chest or lower throat.  Bitter taste in the mouth.  Coughing. DIAGNOSIS  If the usual treatments for heartburn do not improve your symptoms, then tests may be done to see if there is another condition present. Possible tests may include:  X-rays.  Endoscopy. This is when a tube with a light and a camera on the end is used to examine the esophagus and the stomach.  A test to measure the amount of acid in the esophagus (pH test).  A test to see if the  esophagus is working properly (esophageal manometry).  Blood, breath, or stool tests to check for bacteria that cause ulcers. TREATMENT   Your caregiver may tell you to use certain over-the-counter medicines (antacids, acid reducers) for mild heartburn.  Your caregiver may prescribe medicines to decrease the acid in your stomach or protect your stomach lining.  Your caregiver may recommend certain diet changes.  For severe cases, your caregiver may recommend that the head of your bed be elevated on blocks. (Sleeping with more pillows is not an effective treatment as it only changes the position of your head and does not improve the main problem of stomach acid refluxing into the esophagus.) HOME CARE INSTRUCTIONS   Take all medicines as directed by your caregiver.  Raise the head  of your bed by putting blocks under the legs if instructed to by your caregiver.  Do not exercise right after eating.  Avoid eating 2 or 3 hours before bed. Do not lie down right after eating.  Eat small meals throughout the day instead of 3 large meals.  Stop smoking if you smoke.  Maintain a healthy weight.  Identify foods and beverages that make your symptoms worse and avoid them. Foods you may want to avoid include:  Peppers.  Chocolate.  High-fat foods, including fried foods.  Spicy foods.  Garlic and onions.  Citrus fruits, including oranges, grapefruit, lemons, and limes.  Food containing tomatoes or tomato products.  Mint.  Carbonated drinks, caffeinated drinks, and alcohol.  Vinegar. SEEK IMMEDIATE MEDICAL CARE IF:  You have severe chest pain that goes down your arm or into your jaw or neck.  You feel sweaty, dizzy, or lightheaded.  You are short of breath.  You vomit blood.  You have difficulty or pain with swallowing.  You have bloody or black, tarry stools.  You have episodes of heartburn more than 3 times a week for more than 2 weeks. MAKE SURE YOU:  Understand  these instructions.  Will watch your condition.  Will get help right away if you are not doing well or get worse. Document Released: 11/02/2008 Document Revised: 09/08/2011 Document Reviewed: 12/01/2010 [facility] Patient Information 2015 [place], [place]. This information is not intended to replace advice given to you by your health care provider. Make sure you discuss any questions you have with your health care provider.

## 2014-03-23 ENCOUNTER — Encounter (HOSPITAL_COMMUNITY): Payer: Self-pay

## 2014-03-23 ENCOUNTER — Encounter (HOSPITAL_COMMUNITY)
Admission: RE | Admit: 2014-03-23 | Discharge: 2014-03-23 | Disposition: A | Discharge status: Home / Self Care | Payer: 59 | Source: Ambulatory Visit | Attending: Surgery | Admitting: Surgery

## 2014-03-23 ENCOUNTER — Encounter (HOSPITAL_COMMUNITY): Payer: Self-pay | Admitting: Pharmacy Technician

## 2014-03-23 DIAGNOSIS — K8010 Calculus of gallbladder with chronic cholecystitis without obstruction: Secondary | ICD-10-CM | POA: Diagnosis not present

## 2014-03-23 DIAGNOSIS — Z01812 Encounter for preprocedural laboratory examination: Secondary | ICD-10-CM | POA: Insufficient documentation

## 2014-03-23 HISTORY — DX: Unspecified osteoarthritis, unspecified site: M19.90

## 2014-03-23 HISTORY — DX: Gastro-esophageal reflux disease without esophagitis: K21.9

## 2014-03-23 HISTORY — DX: Headache: R51

## 2014-03-23 HISTORY — DX: Sleep disorder, unspecified: G47.9

## 2014-03-23 LAB — BASIC METABOLIC PANEL
ANION GAP: 11 (ref 5–15)
BUN: 13 mg/dL (ref 6–23)
CHLORIDE: 101 meq/L (ref 96–112)
CO2: 27 mEq/L (ref 19–32)
Calcium: 9.6 mg/dL (ref 8.4–10.5)
Creatinine, Ser: 0.75 mg/dL (ref 0.50–1.10)
GFR calc non Af Amer: >90 mL/min (ref >90)
Glucose, Bld: 85 mg/dL (ref 70–99)
Potassium: 4.1 mEq/L (ref 3.7–5.3)
Sodium: 139 mEq/L (ref 137–147)

## 2014-03-23 LAB — CBC
HCT: 39.1 % (ref 36.0–46.0)
HEMOGLOBIN: 13.2 g/dL (ref 12.0–15.0)
MCH: 29.6 pg (ref 26.0–34.0)
MCHC: 33.8 g/dL (ref 30.0–36.0)
MCV: 87.7 fL (ref 78.0–100.0)
Platelets: 207 10*3/uL (ref 150–400)
RBC: 4.46 MIL/uL (ref 3.87–5.11)
RDW: 12.3 % (ref 11.5–15.5)
WBC: 6.2 10*3/uL (ref 4.0–10.5)

## 2014-03-23 NOTE — Pre-Procedure Instructions (Signed)
SHAUNA BODKINS  03/23/2014   Your procedure is scheduled on:  03/27/2014  Report to [facility] Admitting     ENTRANCE A       at 7:15 AM.  Call this number if you have problems the morning of surgery: [phone]   Remember:   Do not eat food or drink liquids after midnight.  On SUNDAY   Take these medicines the morning of surgery with A SIP OF WATER: Pristiq, Prilosec   Do not wear jewelry, make-up or nail polish.  Do not wear lotions, powders, or perfumes. You may wear deodorant.  Do not shave 48 hours prior to surgery.               Do not bring valuables to the hospital.  [facility] is not responsible  for any belongings or valuables.               Contacts, dentures or bridgework may not be worn into surgery.  Leave suitcase in the car. After surgery it may be brought to your room.  For patients admitted to the hospital, discharge time is determined by your                treatment team.               Patients discharged the day of surgery will not be allowed to drive  home.  Name and phone number of your driver: with spouse  Special Instructions: Special Instructions: [place] - Preparing for Surgery  Before surgery, you can play an important role.  Because skin is not sterile, your skin needs to be as free of germs as possible.  You can reduce the number of germs on you skin by washing with CHG (chlorahexidine gluconate) soap before surgery.  CHG is an antiseptic cleaner which kills germs and bonds with the skin to continue killing germs even after washing.  Please DO NOT use if you have an allergy to CHG or antibacterial soaps.  If your skin becomes reddened/irritated stop using the CHG and inform your nurse when you arrive at Short Stay.  Do not shave (including legs and underarms) for at least 48 hours prior to the first CHG shower.  You may shave your face.  Please follow these instructions carefully:   1.  Shower with CHG Soap the night before surgery  and the  morning of Surgery.  2.  If you choose to wash your hair, wash your hair first as usual with your  normal shampoo.  3.  After you shampoo, rinse your hair and body thoroughly to remove the  Shampoo.  4.  Use CHG as you would any other liquid soap.  You can apply chg directly to the skin and wash gently with scrungie or a clean washcloth.  5.  Apply the CHG Soap to your body ONLY FROM THE NECK DOWN.    Do not use on open wounds or open sores.  Avoid contact with your eyes, ears, mouth and genitals (private parts).  Wash genitals (private parts)   with your normal soap.  6.  Wash thoroughly, paying special attention to the area where your surgery will be performed.  7.  Thoroughly rinse your body with warm water from the neck down.  8.  DO NOT shower/wash with your normal soap after using and rinsing off   the CHG Soap.  9.  Pat yourself dry with a clean towel.  10.  Wear clean pajamas.            11.  Place clean sheets on your bed the night of your first shower and do not sleep with pets.  Day of Surgery  Do not apply any lotions/deodorants the morning of surgery.  Please wear clean clothes to the hospital/surgery center.   Please read over the following fact sheets that you were given: Pain Booklet, Coughing and Deep Breathing and Surgical Site Infection Prevention

## 2014-03-26 MED ORDER — CHLORHEXIDINE GLUCONATE 4 % EX LIQD
1.0000 "application " | Freq: Once | CUTANEOUS | Status: DC
  Filled 2014-03-26: qty 15

## 2014-03-27 ENCOUNTER — Encounter (HOSPITAL_COMMUNITY): Payer: 59 | Admitting: Anesthesiology

## 2014-03-27 ENCOUNTER — Ambulatory Visit (HOSPITAL_COMMUNITY)
Admission: RE | Admit: 2014-03-27 | Discharge: 2014-03-27 | Disposition: A | Discharge status: Home / Self Care | Payer: 59 | Source: Ambulatory Visit | Attending: Surgery | Admitting: Surgery

## 2014-03-27 ENCOUNTER — Ambulatory Visit (HOSPITAL_COMMUNITY): Payer: 59 | Admitting: Anesthesiology

## 2014-03-27 ENCOUNTER — Encounter (HOSPITAL_COMMUNITY): Payer: Self-pay | Admitting: *Deleted

## 2014-03-27 ENCOUNTER — Encounter (HOSPITAL_COMMUNITY)
Admission: RE | Disposition: A | Discharge status: Home / Self Care | Payer: Self-pay | Source: Ambulatory Visit | Attending: Surgery

## 2014-03-27 ENCOUNTER — Ambulatory Visit (HOSPITAL_COMMUNITY): Payer: 59

## 2014-03-27 DIAGNOSIS — K8010 Calculus of gallbladder with chronic cholecystitis without obstruction: Secondary | ICD-10-CM | POA: Insufficient documentation

## 2014-03-27 DIAGNOSIS — K219 Gastro-esophageal reflux disease without esophagitis: Secondary | ICD-10-CM | POA: Diagnosis not present

## 2014-03-27 DIAGNOSIS — E739 Lactose intolerance, unspecified: Secondary | ICD-10-CM | POA: Insufficient documentation

## 2014-03-27 DIAGNOSIS — Z90710 Acquired absence of both cervix and uterus: Secondary | ICD-10-CM | POA: Insufficient documentation

## 2014-03-27 DIAGNOSIS — K8020 Calculus of gallbladder without cholecystitis without obstruction: Secondary | ICD-10-CM | POA: Diagnosis present

## 2014-03-27 DIAGNOSIS — I10 Essential (primary) hypertension: Secondary | ICD-10-CM | POA: Insufficient documentation

## 2014-03-27 DIAGNOSIS — Z780 Asymptomatic menopausal state: Secondary | ICD-10-CM | POA: Diagnosis not present

## 2014-03-27 DIAGNOSIS — Z87442 Personal history of urinary calculi: Secondary | ICD-10-CM | POA: Insufficient documentation

## 2014-03-27 DIAGNOSIS — M159 Polyosteoarthritis, unspecified: Secondary | ICD-10-CM | POA: Diagnosis not present

## 2014-03-27 DIAGNOSIS — G2581 Restless legs syndrome: Secondary | ICD-10-CM | POA: Insufficient documentation

## 2014-03-27 DIAGNOSIS — F329 Major depressive disorder, single episode, unspecified: Secondary | ICD-10-CM | POA: Insufficient documentation

## 2014-03-27 DIAGNOSIS — K824 Cholesterolosis of gallbladder: Secondary | ICD-10-CM | POA: Diagnosis not present

## 2014-03-27 DIAGNOSIS — F3289 Other specified depressive episodes: Secondary | ICD-10-CM | POA: Insufficient documentation

## 2014-03-27 HISTORY — PX: LAPAROSCOPIC CHOLECYSTECTOMY SINGLE PORT: SHX5891

## 2014-03-27 SURGERY — LAPAROSCOPIC CHOLECYSTECTOMY SINGLE SITE
Anesthesia: General | Site: Abdomen

## 2014-03-27 MED ORDER — SUCCINYLCHOLINE CHLORIDE 20 MG/ML IJ SOLN
INTRAMUSCULAR | Status: AC
  Filled 2014-03-27: qty 1

## 2014-03-27 MED ORDER — LACTATED RINGERS IV SOLN
INTRAVENOUS | Status: DC | PRN
  Administered 2014-03-27 (×2): via INTRAVENOUS

## 2014-03-27 MED ORDER — MIDAZOLAM HCL 5 MG/5ML IJ SOLN
INTRAMUSCULAR | Status: DC | PRN
  Administered 2014-03-27: 2 mg via INTRAVENOUS

## 2014-03-27 MED ORDER — MIDAZOLAM HCL 2 MG/2ML IJ SOLN
INTRAMUSCULAR | Status: AC
  Filled 2014-03-27: qty 2

## 2014-03-27 MED ORDER — HYDROMORPHONE HCL 1 MG/ML IJ SOLN
INTRAMUSCULAR | Status: AC
  Filled 2014-03-27: qty 1

## 2014-03-27 MED ORDER — BUPIVACAINE-EPINEPHRINE (PF) 0.25% -1:200000 IJ SOLN
INTRAMUSCULAR | Status: AC
  Filled 2014-03-27: qty 30

## 2014-03-27 MED ORDER — OXYCODONE HCL 5 MG PO TABS
ORAL_TABLET | ORAL | Status: AC
  Filled 2014-03-27: qty 2

## 2014-03-27 MED ORDER — FENTANYL CITRATE 0.05 MG/ML IJ SOLN
INTRAMUSCULAR | Status: AC
  Filled 2014-03-27: qty 5

## 2014-03-27 MED ORDER — KETOROLAC TROMETHAMINE 30 MG/ML IJ SOLN
INTRAMUSCULAR | Status: DC | PRN
  Administered 2014-03-27: 30 mg via INTRAVENOUS

## 2014-03-27 MED ORDER — DEXAMETHASONE SODIUM PHOSPHATE 4 MG/ML IJ SOLN
INTRAMUSCULAR | Status: AC
  Filled 2014-03-27: qty 2

## 2014-03-27 MED ORDER — ROCURONIUM BROMIDE 50 MG/5ML IV SOLN
INTRAVENOUS | Status: AC
  Filled 2014-03-27: qty 1

## 2014-03-27 MED ORDER — GLYCOPYRROLATE 0.2 MG/ML IJ SOLN
INTRAMUSCULAR | Status: DC | PRN
  Administered 2014-03-27: .7 mg via INTRAVENOUS
  Administered 2014-03-27: 0.1 mg via INTRAVENOUS

## 2014-03-27 MED ORDER — GLYCOPYRROLATE 0.2 MG/ML IJ SOLN
INTRAMUSCULAR | Status: AC
  Filled 2014-03-27: qty 3

## 2014-03-27 MED ORDER — PHENYLEPHRINE HCL 10 MG/ML IJ SOLN
INTRAMUSCULAR | Status: DC | PRN
  Administered 2014-03-27 (×2): 40 ug via INTRAVENOUS

## 2014-03-27 MED ORDER — PROPOFOL 10 MG/ML IV BOLUS
INTRAVENOUS | Status: DC | PRN
  Administered 2014-03-27: 100 mg via INTRAVENOUS

## 2014-03-27 MED ORDER — DEXAMETHASONE SODIUM PHOSPHATE 4 MG/ML IJ SOLN
INTRAMUSCULAR | Status: DC | PRN
  Administered 2014-03-27: 8 mg via INTRAVENOUS

## 2014-03-27 MED ORDER — PROPOFOL 10 MG/ML IV BOLUS
INTRAVENOUS | Status: AC
  Filled 2014-03-27: qty 20

## 2014-03-27 MED ORDER — ARTIFICIAL TEARS OP OINT
TOPICAL_OINTMENT | OPHTHALMIC | Status: DC | PRN
  Administered 2014-03-27: 1 via OPHTHALMIC

## 2014-03-27 MED ORDER — SODIUM CHLORIDE 0.9 % IR SOLN
Status: DC | PRN
  Administered 2014-03-27: 1000 mL

## 2014-03-27 MED ORDER — ONDANSETRON HCL 4 MG PO TABS: 4.0000 mg | ORAL_TABLET | Freq: Three times a day (TID) | ORAL | Status: DC | PRN

## 2014-03-27 MED ORDER — NEOSTIGMINE METHYLSULFATE 10 MG/10ML IV SOLN
INTRAVENOUS | Status: AC
  Filled 2014-03-27: qty 1

## 2014-03-27 MED ORDER — BUPIVACAINE-EPINEPHRINE 0.25% -1:200000 IJ SOLN
INTRAMUSCULAR | Status: DC | PRN
  Administered 2014-03-27: 30 mL

## 2014-03-27 MED ORDER — ROCURONIUM BROMIDE 100 MG/10ML IV SOLN
INTRAVENOUS | Status: DC | PRN
  Administered 2014-03-27: 40 mg via INTRAVENOUS

## 2014-03-27 MED ORDER — ONDANSETRON HCL 4 MG/2ML IJ SOLN
INTRAMUSCULAR | Status: AC
  Filled 2014-03-27: qty 2

## 2014-03-27 MED ORDER — OXYCODONE HCL 5 MG PO TABS: 5.0000 mg | ORAL_TABLET | Freq: Once | ORAL | Status: DC | PRN

## 2014-03-27 MED ORDER — OXYCODONE HCL 5 MG PO TABS
5.0000 mg | ORAL_TABLET | Freq: Once | ORAL | Status: AC
  Administered 2014-03-27: 10 mg via ORAL

## 2014-03-27 MED ORDER — OXYCODONE HCL 5 MG PO TABS: 5.0000 mg | ORAL_TABLET | ORAL | Status: DC | PRN

## 2014-03-27 MED ORDER — OXYCODONE HCL 5 MG/5ML PO SOLN: 5.0000 mg | Freq: Once | ORAL | Status: DC | PRN

## 2014-03-27 MED ORDER — NEOSTIGMINE METHYLSULFATE 10 MG/10ML IV SOLN
INTRAVENOUS | Status: DC | PRN
  Administered 2014-03-27: 4.5 mg via INTRAVENOUS

## 2014-03-27 MED ORDER — SCOPOLAMINE 1 MG/3DAYS TD PT72
1.0000 | MEDICATED_PATCH | Freq: Once | TRANSDERMAL | Status: AC
  Administered 2014-03-27: 1 via TRANSDERMAL
  Filled 2014-03-27: qty 1

## 2014-03-27 MED ORDER — LACTATED RINGERS IV SOLN
INTRAVENOUS | Status: DC
  Administered 2014-03-27: 08:00:00 via INTRAVENOUS

## 2014-03-27 MED ORDER — KETOROLAC TROMETHAMINE 30 MG/ML IJ SOLN
INTRAMUSCULAR | Status: AC
  Filled 2014-03-27: qty 1

## 2014-03-27 MED ORDER — HYDROMORPHONE HCL 1 MG/ML IJ SOLN
0.2500 mg | INTRAMUSCULAR | Status: DC | PRN
Start: 2014-03-27 — End: 2014-03-27
  Administered 2014-03-27 (×3): 0.5 mg via INTRAVENOUS

## 2014-03-27 MED ORDER — FENTANYL CITRATE 0.05 MG/ML IJ SOLN
INTRAMUSCULAR | Status: DC | PRN
  Administered 2014-03-27 (×3): 50 ug via INTRAVENOUS
  Administered 2014-03-27: 25 ug via INTRAVENOUS

## 2014-03-27 MED ORDER — HYDROMORPHONE HCL 1 MG/ML IJ SOLN
INTRAMUSCULAR | Status: AC
  Administered 2014-03-27: 0.5 mg via INTRAVENOUS
  Filled 2014-03-27: qty 1

## 2014-03-27 MED ORDER — LIDOCAINE HCL 4 % MT SOLN
OROMUCOSAL | Status: DC | PRN
Start: 2014-03-27 — End: 2014-03-27
  Administered 2014-03-27: 2 mL via TOPICAL

## 2014-03-27 MED ORDER — LIDOCAINE HCL (CARDIAC) 20 MG/ML IV SOLN
INTRAVENOUS | Status: AC
  Filled 2014-03-27: qty 5

## 2014-03-27 MED ORDER — ONDANSETRON HCL 4 MG/2ML IJ SOLN
INTRAMUSCULAR | Status: DC | PRN
  Administered 2014-03-27: 4 mg via INTRAVENOUS

## 2014-03-27 MED ORDER — SODIUM CHLORIDE 0.9 % IV SOLN
INTRAVENOUS | Status: DC | PRN
  Administered 2014-03-27: 09:00:00

## 2014-03-27 MED ORDER — LIDOCAINE HCL (CARDIAC) 20 MG/ML IV SOLN
INTRAVENOUS | Status: DC | PRN
Start: 2014-03-27 — End: 2014-03-27
  Administered 2014-03-27: 80 mg via INTRAVENOUS

## 2014-03-27 SURGICAL SUPPLY — 51 items
APPLIER CLIP 5 13 M/L LIGAMAX5 (MISCELLANEOUS) ×3
APR CLP MED LRG 5 ANG JAW (MISCELLANEOUS) ×1
BAG SPEC RTRVL LRG 6X4 10 (ENDOMECHANICALS)
BLADE SURG ROTATE 9660 (MISCELLANEOUS) IMPLANT
CANISTER SUCTION 2500CC (MISCELLANEOUS) ×3 IMPLANT
CHLORAPREP W/TINT 26ML (MISCELLANEOUS) ×3 IMPLANT
CLIP APPLIE 5 13 M/L LIGAMAX5 (MISCELLANEOUS) ×1 IMPLANT
COVER MAYO STAND STRL (DRAPES) IMPLANT
COVER SURGICAL LIGHT HANDLE (MISCELLANEOUS) ×3 IMPLANT
DECANTER SPIKE VIAL GLASS SM (MISCELLANEOUS) ×3 IMPLANT
DRAPE C-ARM 42X72 X-RAY (DRAPES) ×3 IMPLANT
DRAPE UTILITY 15X26 W/TAPE STR (DRAPE) IMPLANT
DRAPE WARM FLUID 44X44 (DRAPE) ×3 IMPLANT
DRSG TEGADERM 4X4.75 (GAUZE/BANDAGES/DRESSINGS) ×3 IMPLANT
ELECT REM PT RETURN 9FT ADLT (ELECTROSURGICAL) ×3
ELECTRODE REM PT RTRN 9FT ADLT (ELECTROSURGICAL) ×1 IMPLANT
ENDOLOOP SUT PDS II  0 18 (SUTURE)
ENDOLOOP SUT PDS II 0 18 (SUTURE) IMPLANT
GAUZE SPONGE 2X2 8PLY STRL LF (GAUZE/BANDAGES/DRESSINGS) ×1 IMPLANT
GLOVE BIO SURGEON STRL SZ7 (GLOVE) ×3 IMPLANT
GLOVE BIO SURGEON STRL SZ7.5 (GLOVE) ×3 IMPLANT
GLOVE BIOGEL PI IND STRL 7.0 (GLOVE) ×1 IMPLANT
GLOVE BIOGEL PI IND STRL 7.5 (GLOVE) ×1 IMPLANT
GLOVE BIOGEL PI IND STRL 8 (GLOVE) ×2 IMPLANT
GLOVE BIOGEL PI INDICATOR 7.0 (GLOVE) ×2
GLOVE BIOGEL PI INDICATOR 7.5 (GLOVE) ×2
GLOVE BIOGEL PI INDICATOR 8 (GLOVE) ×4
GLOVE ECLIPSE 8.0 STRL XLNG CF (GLOVE) ×3 IMPLANT
GOWN STRL REUS W/ TWL LRG LVL3 (GOWN DISPOSABLE) ×2 IMPLANT
GOWN STRL REUS W/ TWL XL LVL3 (GOWN DISPOSABLE) ×1 IMPLANT
GOWN STRL REUS W/TWL LRG LVL3 (GOWN DISPOSABLE) ×6
GOWN STRL REUS W/TWL XL LVL3 (GOWN DISPOSABLE) ×3
KIT BASIN OR (CUSTOM PROCEDURE TRAY) ×3 IMPLANT
KIT ROOM TURNOVER OR (KITS) ×3 IMPLANT
NEEDLE 22X1 1/2 (OR ONLY) (NEEDLE) IMPLANT
NS IRRIG 1000ML POUR BTL (IV SOLUTION) ×3 IMPLANT
PAD ARMBOARD 7.5X6 YLW CONV (MISCELLANEOUS) ×6 IMPLANT
POUCH SPECIMEN RETRIEVAL 10MM (ENDOMECHANICALS) IMPLANT
SCALPEL HARMONIC ACE (MISCELLANEOUS) ×3 IMPLANT
SCISSORS LAP 5X35 DISP (ENDOMECHANICALS) ×3 IMPLANT
SET CHOLANGIOGRAPH 5 50 .035 (SET/KITS/TRAYS/PACK) ×3 IMPLANT
SET IRRIG TUBING LAPAROSCOPIC (IRRIGATION / IRRIGATOR) ×3 IMPLANT
SPECIMEN JAR SMALL (MISCELLANEOUS) ×3 IMPLANT
SPONGE GAUZE 2X2 STER 10/PKG (GAUZE/BANDAGES/DRESSINGS) ×2
SUT MNCRL AB 4-0 PS2 18 (SUTURE) ×3 IMPLANT
SUT VICRYL 0 TIES 12 18 (SUTURE) IMPLANT
TOWEL OR 17X26 10 PK STRL BLUE (TOWEL DISPOSABLE) ×3 IMPLANT
TRAY LAPAROSCOPIC (CUSTOM PROCEDURE TRAY) ×3 IMPLANT
TROCAR 5M 150ML BLDLS (TROCAR) ×3 IMPLANT
TROCAR XCEL NON-BLD 5MMX100MML (ENDOMECHANICALS) ×3 IMPLANT
TUBING INSUFF HIGH FLOW RTP (TUBING) ×3 IMPLANT

## 2014-03-27 NOTE — H&P (Signed)
[facility]  [address] [place]  [place], [place] [address] Phone: [phone] FAX: [phone]     [name]  22-Jun-1961 [id number]  CARE TEAM:  PCP: Pamelia Hoit, MD  Outpatient Care Team: Patient Care Team: Barbie Banner, MD as PCP - General (Family Medicine) Griffith Citron, MD as Consulting Physician (Gastroenterology)  Inpatient Treatment Team: Treatment Team: Attending Provider: Karie Soda, MD  This patient is a 53 y.o.female who presents today for surgical evaluation   Pleasant overweight female. History of heartburn usually controlled with antacid medication. Had an episode of severe upper abdominal pain. We will carotid and a metal at night. No help with Gas-X. Very intense. N/V. [facility] to [facility] [place] [facility]. Gallbladder wall thickening and gallstones noted. Suspicious for gallbladder attack. Symptoms improved. Surgical consultation recommended.   No personal nor family history of GI/colon cancer, inflammatory bowel disease, irritable bowel syndrome, allergy such as Celiac Sprue, dietary/dairy problems, colitis, ulcers nor gastritis. No recent sick contacts/gastroenteritis. No travel outside the country. No changes in diet. No dysphagia to solids or liquids. No significant heartburn or reflux. No hematochezia, hematemesis, coffee ground emesis. No evidence of prior gastric/peptic ulceration. Does have constipation with bowel movements every 2-3 days. Can walk about 30 minutes before having to stop. Recalls a normal colonoscopy by Dr. Kinnie Scales a few years ago.  Seen by me last month.  No major new events.     Past Medical History  Diagnosis Date  . Menopause   . Depression   . Hypertension   . Lactose intolerance   . Sleep disorder     had sleep study, was told that she has restless leg syndrome, states that she has just learned to live with it, no Rx thus far.   . Renal calculus  or stone     passes on her own  . GERD (gastroesophageal reflux disease)   . Headache(784.0)     has gone off caffeine & done with out headache for a long time ago   . Arthritis     upper back & hips - OA    Past Surgical History  Procedure Laterality Date  . Abdominal hysterectomy  4/09    total  . Cesarean section  7/83  . Abdominal surgery      "tummy tuck "  . Eye surgery Bilateral     lasik  . Vaginal delivery      prior to C/Section    History   Social History  . Marital Status: Married    Spouse Name: N/A    Number of Children: N/A  . Years of Education: N/A   Occupational History  . Not on file.   Social History Main Topics  . Smoking status: Never Smoker   . Smokeless tobacco: Never Used  . Alcohol Use: No  . Drug Use: No  . Sexual Activity: Yes    Birth Control/ Protection: Surgical   Other Topics Concern  . Not on file   Social History Narrative  . No narrative on file    Family History  Problem Relation Age of Onset  . Cancer Father     brown lung    Current Facility-Administered Medications  Medication Dose Route Frequency Provider Last Rate Last Dose  . chlorhexidine (HIBICLENS) 4 % liquid 1 application  1 application Topical Once Karie Soda, MD      . chlorhexidine (HIBICLENS) 4 % liquid 1 application  1 application Topical Once Karie Soda, MD      . lactated ringers infusion   Intravenous Continuous Rosezella Florida, MD 10 mL/hr at 03/27/14 0813    . scopolamine (TRANSDERM-SCOP) 1 MG/3DAYS 1.5 mg  1 patch Transdermal Once Rosezella Florida, MD       Facility-Administered Medications Ordered in Other Encounters  Medication Dose Route Frequency Provider Last Rate Last Dose  . lactated ringers infusion    Continuous PRN Edmonia Caprio, CRNA         Allergies  Allergen Reactions  . Penicillins Swelling    tongue  . Compazine   . Prochlorperazine Edisylate Diarrhea    ROS: Constitutional:  No fevers, chills, sweats.   Weight stable Eyes:  No vision changes, No discharge HENT:  No sore throats, nasal drainage Lymph: No neck swelling, No bruising easily Pulmonary:  No cough, productive sputum CV: No orthopnea, PND  No exertional chest/neck/shoulder/arm pain. GI:  No personal nor family history of GI/colon cancer, inflammatory bowel disease, irritable bowel syndrome, allergy such as Celiac Sprue, dietary/dairy problems, colitis, ulcers nor gastritis.  No recent sick contacts/gastroenteritis.  No travel outside the country.  No changes in diet. Renal: No UTIs, No hematuria Genital:  No drainage, bleeding, masses Musculoskeletal: No severe joint pain.  Good ROM major joints Skin:  No sores or lesions.  No rashes Heme/Lymph:  No easy bleeding.  No swollen lymph nodes Neuro: No focal weakness/numbness.  No seizures Psych: No suicidal ideation.  No hallucinations  BP 107/63  Pulse 77  Temp(Src) 98.6 F (37 C) (Oral)  Resp 18  SpO2 96%  Physical Exam: General: Pt awake/alert/oriented x4 in no major acute distress Eyes: PERRL, normal EOM. Sclera nonicteric Neuro: CN II-XII intact w/o focal sensory/motor deficits. Lymph: No head/neck/groin lymphadenopathy Psych:  No delerium/psychosis/paranoia HENT: Normocephalic, Mucus membranes moist.  No thrush Neck: Supple, No tracheal deviation Chest: No pain.  Good respiratory excursion. CV:  Pulses intact.  Regular rhythm Abdomen: Soft, Nondistended.  Mild RUQ TTP.  No incarcerated hernias. Ext:  SCDs BLE.  No significant edema.  No cyanosis Skin: No petechiae / purpurea.  No major sores Musculoskeletal: No severe joint pain.  Good ROM major joints   Results:   Labs: No results found for this or any previous visit (from the past 48 hour(s)).  Imaging / Studies: No results found.  Medications / Allergies: per chart  Antibiotics: Anti-infectives   None      Assessment  [name]  53 y.o. female  Day of Surgery  Procedure(s): LAPAROSCOPIC  CHOLECYSTECTOMY SINGLE PORT WITH INTRAOPERATIVE CHOLANGIOGRAM   Problem List:  Principal Problem:   Chronic cholecystitis with calculus   Chronic cholecystitis  Plan:  Lap chole:  The anatomy & physiology of hepatobiliary & pancreatic function was discussed.  The pathophysiology of gallbladder dysfunction was discussed.  Natural history risks without surgery was discussed.   I feel the risks of no intervention will lead to serious problems that outweigh the operative risks; therefore, I recommended cholecystectomy to remove the pathology.  I explained laparoscopic techniques with possible need for an open approach.  Probable cholangiogram to evaluate the bilary tract was explained as well.    Risks such as bleeding, infection, abscess, leak, injury to other organs, need for further treatment, heart attack, death, and other risks were discussed.  I noted a good likelihood this will help address the problem.  Possibility that this will not correct all abdominal symptoms was explained.  Goals of post-operative recovery were discussed as well.  We will work to minimize complications.  An educational handout further explaining the pathology and treatment options was given as well.  Questions were answered.  The patient expresses understanding & wishes to proceed with surgery.    -VTE prophylaxis- SCDs, etc -mobilize as tolerated to help recovery    Ardeth Sportsman, M.D., F.A.C.S. Gastrointestinal and Minimally Invasive Surgery [facility] [address] [place], [place] [address] [phone] Main / Paging   03/27/2014  Note: Portions of this report may have been transcribed using voice recognition software. Every effort was made to ensure accuracy; however, inadvertent computerized transcription errors may be present.   Any transcriptional errors that result from this process are unintentional.

## 2014-03-27 NOTE — Op Note (Addendum)
03/27/2014  10:47 AM  PATIENT:  [name]  53 y.o. female  Patient Care Team: Barbie Banner, MD as PCP - General (Family Medicine) Griffith Citron, MD as Consulting Physician (Gastroenterology)  PRE-OPERATIVE DIAGNOSIS:  Biliary Colic  POST-OPERATIVE DIAGNOSIS:  Chronic cholecystitis with calculus  PROCEDURE:  Procedure(s): LAPAROSCOPIC CHOLECYSTECTOMY SINGLE PORT WITH INTRAOPERATIVE CHOLANGIOGRAM   SURGEON:  Surgeon(s): Karie Soda, MD  ASSISTANT: RNFA   ANESTHESIA:   local and general  EBL:  Total I/O In: 1000 [I.V.:1000] Out: -   Delay start of Pharmacological VTE agent (>24hrs) due to surgical blood loss or risk of bleeding:  no  DRAINS: none   SPECIMEN:  Source of Specimen:  Gallbladder   DISPOSITION OF SPECIMEN:  PATHOLOGY  COUNTS:  YES  PLAN OF CARE: Discharge to home after PACU  PATIENT DISPOSITION:  PACU - hemodynamically stable.  INDICATION: Pleasant woman with classic episodes of biliary colic and stones.  I made recommendation to consider cholecystectomy:  The anatomy & physiology of hepatobiliary & pancreatic function was discussed.  The pathophysiology of gallbladder dysfunction was discussed.  Natural history risks without surgery was discussed.   I feel the risks of no intervention will lead to serious problems that outweigh the operative risks; therefore, I recommended cholecystectomy to remove the pathology.  I explained laparoscopic techniques with possible need for an open approach.  Probable cholangiogram to evaluate the bilary tract was explained as well.    Risks such as bleeding, infection, abscess, leak, injury to other organs, need for further treatment, heart attack, death, and other risks were discussed.  I noted a good likelihood this will help address the problem.  Possibility that this will not correct all abdominal symptoms was explained.  Goals of post-operative recovery were discussed as well.  We will work to minimize  complications.  An educational handout further explaining the pathology and treatment options was given as well.  Questions were answered.  The patient expresses understanding & wishes to proceed with surgery.   OR FINDINGS: Grey, boggy, dilated gallbladder consistent with chronic cholecystitis.  Cholangiogram showing narrowed intrahepatic biliary system but intact.  No evidence of CBD obstruction/mass/stricutre.  DESCRIPTION:   The patient was identified & brought in the operating room. The patient was positioned supine with arms tucked. SCDs were active during the entire case. The patient underwent general anesthesia without any difficulty.  The abdomen was prepped and draped in a sterile fashion. A Surgical Timeout confirmed our plan.  I made a transverse curvilinear incision through the superior umbilical fold.  I placed a 5mm long port through the supraumbilical fascia using a modified Hassan cutdown technique. I began carbon dioxide insufflation. Camera inspection revealed no injury. There were no adhesions to the anterior abdominal wall supraumbilically.  I proceeded to continue with single site technique. I placed a #5 port in left upper aspect of the wound. I placed a 5 mm atraumatic grasper in the right inferior aspect of the wound.  I turned attention to the right upper quadrant.  The gallbladder fundus was elevated cephalad. I freed the peritoneal coverings between the gallbladder and the liver on the posteriolateral and anteriomedial walls. I alternated between Harmonic & blunt Maryland dissection to help get a good critical view of the cystic artery and cystic duct. I did further dissection to free a few centimeters of the  gallbladder off the liver bed to get a good critical view of the infundibulum and cystic duct. I mobilized the cystic artery;  and, after getting a good 360 view, ligated the cystic artery using the Harmonic ultrasonic dissection. I skeletonized the cystic duct.  I  placed a clip on the infundibulum. I did a partial cystic duct-otomy and ensured patency. I placed a 5 French cholangiocatheterJamaicarough a puncture site at the right subcostal ridge of the abdominal wall and directed it into the cystic duct.  We ran a cholangiogram with dilute radio-opaque contrast and continuous fluoroscopy. Contrast flowed from a side branch consistent with cystic duct cannulization. Contrast flowed up the common hepatic duct into the right and left intrahepatic chains out to secondary radicals. Intrahepatic biliary system narrowed.  Persistently narrowed on second run.  Contrast flowed down the common bile duct easily across the normal ampulla into the duodenum.  This was consistent with a normal cholangiogram.  I removed the cholangiocatheter. I placed clips on the cystic duct x4.  I completed cystic duct transection. I freed the gallbladder from its remaining attachments to the liver. I ensured hemostasis on the gallbladder fossa of the liver and elsewhere. I inspected the rest of the abdomen & detected no injury nor bleeding elsewhere.  I removed the gallbladder out the supraumbilical fascia. I closed the fascia transversely using 0 Vicryl interrupted stitches. A closed the skin using 4-0 monocryl stitch.  Sterile dressing was applied. The patient was extubated & arrived in the PACU in stable condition..  I had discussed postoperative care with the patient & her husband in the holding area. I did locate the patient's husband and discussed operative findings and postoperative goals / instructions.  Instructions are written in the chart as well.  Ardeth Sportsman, M.D., F.A.C.S. Gastrointestinal and Minimally Invasive Surgery [facility] [address], [address] [place], [place] [address] [phone] Main / Paging

## 2014-03-27 NOTE — Transfer of Care (Signed)
Immediate Anesthesia Transfer of Care Note  Patient: [name]  Procedure(s) Performed: Procedure(s): LAPAROSCOPIC CHOLECYSTECTOMY SINGLE PORT WITH INTRAOPERATIVE CHOLANGIOGRAM  (N/A)  Patient Location: PACU  Anesthesia Type:General  Level of Consciousness: awake and alert   Airway & Oxygen Therapy: Patient Spontanous Breathing and Patient connected to nasal cannula oxygen  Post-op Assessment: Report given to PACU RN, Post -op Vital signs reviewed and stable and Patient moving all extremities  Post vital signs: Reviewed and stable  Complications: No apparent anesthesia complications

## 2014-03-27 NOTE — Anesthesia Preprocedure Evaluation (Addendum)
Anesthesia Evaluation  Patient identified by MRN, date of birth, ID band Patient awake    Reviewed: Allergy & Precautions, H&P , NPO status , Patient's Chart, lab work & pertinent test results  Airway Mallampati: II TM Distance: >3 FB Neck ROM: Full    Dental no notable dental hx. (+) Teeth Intact, Dental Advisory Given   Pulmonary neg pulmonary ROS,  breath sounds clear to auscultation  Pulmonary exam normal       Cardiovascular hypertension, Pt. on medications Rhythm:Regular Rate:Normal     Neuro/Psych  Headaches, Depression    GI/Hepatic Neg liver ROS, GERD-  Medicated,  Endo/Other  negative endocrine ROS  Renal/GU negative Renal ROS  negative genitourinary   Musculoskeletal   Abdominal   Peds  Hematology negative hematology ROS (+)   Anesthesia Other Findings   Reproductive/Obstetrics negative OB ROS                          Anesthesia Physical Anesthesia Plan  ASA: II  Anesthesia Plan: General   Post-op Pain Management:    Induction: Intravenous  Airway Management Planned: Oral ETT  Additional Equipment:   Intra-op Plan:   Post-operative Plan: Extubation in OR  Informed Consent: I have reviewed the patients History and Physical, chart, labs and discussed the procedure including the risks, benefits and alternatives for the proposed anesthesia with the patient or authorized representative who has indicated his/her understanding and acceptance.   Dental advisory given  Plan Discussed with: CRNA  Anesthesia Plan Comments:         Anesthesia Quick Evaluation

## 2014-03-27 NOTE — Anesthesia Postprocedure Evaluation (Signed)
  Anesthesia Post-op Note  Patient: [name]  Procedure(s) Performed: Procedure(s): LAPAROSCOPIC CHOLECYSTECTOMY SINGLE PORT WITH INTRAOPERATIVE CHOLANGIOGRAM  (N/A)  Patient Location: PACU  Anesthesia Type:General  Level of Consciousness: awake and alert   Airway and Oxygen Therapy: Patient Spontanous Breathing  Post-op Pain: mild  Post-op Assessment: Post-op Vital signs reviewed, Patient's Cardiovascular Status Stable and Respiratory Function Stable  Post-op Vital Signs: Reviewed  Filed Vitals:   03/27/14 1145  BP:   Pulse: 89  Temp: 36.7 C  Resp: 15    Complications: No apparent anesthesia complications

## 2014-03-27 NOTE — Discharge Instructions (Signed)
LAPAROSCOPIC SURGERY: POST OP INSTRUCTIONS ° °1. DIET: Follow a light bland diet the first 24 hours after arrival home, such as soup, liquids, crackers, etc.  Be sure to include lots of fluids daily.  Avoid fast food or heavy meals as your are more likely to get nauseated.  Eat a low fat the next few days after surgery.   °2. Take your usually prescribed home medications unless otherwise directed. °3. PAIN CONTROL: °a. Pain is best controlled by a usual combination of three different methods TOGETHER: °i. Ice/Heat °ii. Over the counter pain medication °iii. Prescription pain medication °b. Most patients will experience some swelling and bruising around the incisions.  Ice packs or heating pads (30-60 minutes up to 6 times a day) will help. Use ice for the first few days to help decrease swelling and bruising, then switch to heat to help relax tight/sore spots and speed recovery.  Some people prefer to use ice alone, heat alone, alternating between ice & heat.  Experiment to what works for you.  Swelling and bruising can take several weeks to resolve.   °c. It is helpful to take an over-the-counter pain medication regularly for the first few weeks.  Choose one of the following that works best for you: °i. Naproxen (Aleve, etc)  Two 220mg tabs twice a day °ii. Ibuprofen (Advil, etc) Three 200mg tabs four times a day (every meal & bedtime) °iii. Acetaminophen (Tylenol, etc) 500-650mg four times a day (every meal & bedtime) °d. A  prescription for pain medication (such as oxycodone, hydrocodone, etc) should be given to you upon discharge.  Take your pain medication as prescribed.  °i. If you are having problems/concerns with the prescription medicine (does not control pain, nausea, vomiting, rash, itching, etc), please call us [phone] to see if we need to switch you to a different pain medicine that will work better for you and/or control your side effect better. °ii. If you need a refill on your pain medication,  please contact your pharmacy.  They will contact our office to request authorization. Prescriptions will not be filled after 5 pm or on week-ends. °4. Avoid getting constipated.  Between the surgery and the pain medications, it is common to experience some constipation.  Increasing fluid intake and taking a fiber supplement (such as Metamucil, Citrucel, FiberCon, MiraLax, etc) 1-2 times a day regularly will usually help prevent this problem from occurring.  A mild laxative (prune juice, Milk of Magnesia, MiraLax, etc) should be taken according to package directions if there are no bowel movements after 48 hours.   °5. Watch out for diarrhea.  If you have many loose bowel movements, simplify your diet to bland foods & liquids for a few days.  Stop any stool softeners and decrease your fiber supplement.  Switching to mild anti-diarrheal medications (Kayopectate, Pepto Bismol) can help.  If this worsens or does not improve, please call us. °6. Wash / shower every day.  You may shower over the dressings as they are waterproof.  Continue to shower over incision(s) after the dressing is off. °7. Remove your waterproof bandages 5 days after surgery.  You may leave the incision open to air.  You may replace a dressing/Band-Aid to cover the incision for comfort if you wish.  °8. ACTIVITIES as tolerated:   °a. You may resume regular (light) daily activities beginning the next day--such as daily self-care, walking, climbing stairs--gradually increasing activities as tolerated.  If you can walk 30 minutes without difficulty, it   is safe to try more intense activity such as jogging, treadmill, bicycling, low-impact aerobics, swimming, etc. b. Save the most intensive and strenuous activity for last such as sit-ups, heavy lifting, contact sports, etc  Refrain from any heavy lifting or straining until you are off narcotics for pain control.   c. DO NOT PUSH THROUGH PAIN.  Let pain be your guide: If it hurts to do something, don't  do it.  Pain is your body warning you to avoid that activity for another week until the pain goes down. d. You may drive when you are no longer taking prescription pain medication, you can comfortably wear a seatbelt, and you can safely maneuver your car and apply brakes. e. Bonita Quin may have sexual intercourse when it is comfortable.  9. FOLLOW UP in our office a. Please call [facility] at [phone] to set up an appointment to see your surgeon in the office for a follow-up appointment approximately 2-3 weeks after your surgery. b. Make sure that you call for this appointment the day you arrive home to insure a convenient appointment time. 10. IF YOU HAVE DISABILITY OR FAMILY LEAVE FORMS, BRING THEM TO THE OFFICE FOR PROCESSING.  DO NOT GIVE THEM TO YOUR DOCTOR.   WHEN TO CALL us [phone]: 1. Poor pain control 2. Reactions / problems with new medications (rash/itching, nausea, etc)  3. Fever over 101.5 F (38.5 C) 4. Inability to urinate 5. Nausea and/or vomiting 6. Worsening swelling or bruising 7. Continued bleeding from incision. 8. Increased pain, redness, or drainage from the incision   The clinic staff is available to answer your questions during regular business hours (8:30am-5pm).  Please dont hesitate to call and ask to speak to one of our nurses for clinical concerns.   If you have a medical emergency, go to the nearest emergency room or call 911.  A surgeon from [facility] is always on call at the [facility], [place] [address], [place], [place]  [address] ? MAIN: [phone] ? TOLL FREE: [phone] ?  FAX [phone] [email or web address]  General Anesthesia, Adult, Care After  Refer to this sheet in the next few weeks. These instructions provide you with information on caring for yourself after your procedure. Your health care provider may also give you more specific instructions. Your treatment has  been planned according to current medical practices, but problems sometimes occur. Call your health care provider if you have any problems or questions after your procedure.  WHAT TO EXPECT AFTER THE PROCEDURE  After the procedure, it is typical to experience:  Sleepiness.  Nausea and vomiting. HOME CARE INSTRUCTIONS  For the first 24 hours after general anesthesia:  Have a responsible person with you.  Do not drive a car. If you are alone, do not take public transportation.  Do not drink alcohol.  Do not take medicine that has not been prescribed by your health care provider.  Do not sign important papers or make important decisions.  You may resume a normal diet and activities as directed by your health care provider.  Change bandages (dressings) as directed.  If you have questions or problems that seem related to general anesthesia, call the hospital and ask for the anesthetist or anesthesiologist on call. SEEK MEDICAL CARE IF:  You have nausea and vomiting that continue the day after anesthesia.  You develop a rash. SEEK IMMEDIATE MEDICAL CARE IF:  You have difficulty breathing.  You  have chest pain.  You have any allergic problems. Document Released: 09/22/2000 Document Revised: 02/16/2013 Document Reviewed: 12/30/2012  [facility] Patient Information 2014 [place], [place].     Managing Pain  Pain after surgery or related to activity is often due to strain/injury to muscle, tendon, nerves and/or incisions.  This pain is usually short-term and will improve in a few months.   Many people find it helpful to do the following things TOGETHER to help speed the process of healing and to get back to regular activity more quickly:  1. Avoid heavy physical activity a.  no lifting greater than 20 pounds b. Do not push through the pain.  Listen to your body and avoid positions and maneuvers than reproduce the pain c. Walking is okay as tolerated, but go slowly and stop when getting sore.   d. Remember: If it hurts to do it, then dont do it! 2. Take Anti-inflammatory medication  a. Take with food/snack around the clock for 1-2 weeks i. This helps the muscle and nerve tissues become less irritable and calm down faster b. Choose ONE of the following over-the-counter medications: i. Naproxen  tabs (ex. Aleve) 1-2 pills twice a day  ii. Ibuprofen  tabs (ex. Advil, Motrin) 3-4 pills with every meal and just before bedtime iii. Acetaminophen  tabs (Tylenol) 1-2 pills with every meal and just before bedtime 3. Use a Heating pad or Ice/Cold Pack a. 4-6 times a day b. May use warm bath/hottub  or showers 4. Try Gentle Massage and/or Stretching  a. at the area of pain many times a day b. stop if you feel pain - do not overdo it  Try these steps together to help you body heal faster and avoid making things get worse.  Doing just one of these things may not be enough.    If you are not getting better after two weeks or are noticing you are getting worse, contact our office for further advice; we may need to re-evaluate you & see what other things we can do to help.  GETTING TO GOOD BOWEL HEALTH. Irregular bowel habits such as constipation and diarrhea can lead to many problems over time.  Having one soft bowel movement a day is the most important way to prevent further problems.  The anorectal canal is designed to handle stretching and feces to safely manage our ability to get rid of solid waste (feces, poop, stool) out of our body.  BUT, hard constipated stools can act like ripping concrete bricks and diarrhea can be a burning fire to this very sensitive area of our body, causing inflamed hemorrhoids, anal fissures, increasing risk is perirectal abscesses, abdominal pain/bloating, an making irritable bowel worse.     The goal: ONE SOFT BOWEL MOVEMENT A DAY!  To have soft, regular bowel movements:    Drink at least 8 tall glasses of water a day.     Take plenty of fiber.   Fiber is the undigested part of plant food that passes into the colon, acting s natures broom to encourage bowel motility and movement.  Fiber can absorb and hold large amounts of water. This results in a larger, bulkier stool, which is soft and easier to pass. Work gradually over several weeks up to 6 servings a day of fiber (25g a day even more if needed) in the form of: o Vegetables -- Root (potatoes, carrots, turnips), leafy green (lettuce, salad greens, celery, spinach), or cooked high residue (cabbage, broccoli, etc) o Fruit --  Fresh (unpeeled skin & pulp), Dried (prunes, apricots, cherries, etc ),  or stewed ( applesauce)  o Whole grain breads, pasta, etc (whole wheat)  o Bran cereals    Bulking Agents -- This type of water-retaining fiber generally is easily obtained each day by one of the following:  o Psyllium bran -- The psyllium plant is remarkable because its ground seeds can retain so much water. This product is available as Metamucil, Konsyl, Effersyllium, Per Diem Fiber, or the less expensive generic preparation in drug and health food stores. Although labeled a laxative, it really is not a laxative.  o Methylcellulose -- This is another fiber derived from wood which also retains water. It is available as Citrucel. o Polyethylene Glycol - and artificial fiber commonly called Miralax or Glycolax.  It is helpful for people with gassy or bloated feelings with regular fiber o Flax Seed - a less gassy fiber than psyllium   No reading or other relaxing activity while on the toilet. If bowel movements take longer than 5 minutes, you are too constipated   AVOID CONSTIPATION.  High fiber and water intake usually takes care of this.  Sometimes a laxative is needed to stimulate more frequent bowel movements, but    Laxatives are not a good long-term solution as it can wear the colon out. o Osmotics (Milk of Magnesia, Fleets phosphosoda, Magnesium citrate, MiraLax, GoLytely) are safer than   o Stimulants (Senokot, Castor Oil, Dulcolax, Ex Lax)    o Do not take laxatives for more than 7days in a row.    IF SEVERELY CONSTIPATED, try a Bowel Retraining Program: o Do not use laxatives.  o Eat a diet high in roughage, such as bran cereals and leafy vegetables.  o Drink six (6) ounces of prune or apricot juice each morning.  o Eat two (2) large servings of stewed fruit each day.  o Take one (1) heaping tablespoon of a psyllium-based bulking agent twice a day. Use sugar-free sweetener when possible to avoid excessive calories.  o Eat a normal breakfast.  o Set aside 15 minutes after breakfast to sit on the toilet, but do not strain to have a bowel movement.  o If you do not have a bowel movement by the third day, use an enema and repeat the above steps.    Controlling diarrhea o Switch to liquids and simpler foods for a few days to avoid stressing your intestines further. o Avoid dairy products (especially milk & ice cream) for a short time.  The intestines often can lose the ability to digest lactose when stressed. o Avoid foods that cause gassiness or bloating.  Typical foods include beans and other legumes, cabbage, broccoli, and dairy foods.  Every person has some sensitivity to other foods, so listen to our body and avoid those foods that trigger problems for you. o Adding fiber (Citrucel, Metamucil, psyllium, Miralax) gradually can help thicken stools by absorbing excess fluid and retrain the intestines to act more normally.  Slowly increase the dose over a few weeks.  Too much fiber too soon can backfire and cause cramping & bloating. o Probiotics (such as active yogurt, Align, etc) may help repopulate the intestines and colon with normal bacteria and calm down a sensitive digestive tract.  Most studies show it to be of mild help, though, and such products can be costly. o Medicines:   Bismuth subsalicylate (ex. Kayopectate, Pepto Bismol) every 30 minutes for up to 6 doses can help  control diarrhea.  Avoid if pregnant.   Loperamide (Immodium) can slow down diarrhea.  Start with two tablets (  total) first and then try one tablet every 6 hours.  Avoid if you are having fevers or severe pain.  If you are not better or start feeling worse, stop all medicines and call your doctor for advice o Call your doctor if you are getting worse or not better.  Sometimes further testing (cultures, endoscopy, X-ray studies, bloodwork, etc) may be needed to help diagnose and treat the cause of the diarrhea.   Cholelithiasis Cholelithiasis (also called gallstones) is a form of gallbladder disease in which gallstones form in your gallbladder. The gallbladder is an organ that stores bile made in the liver, which helps digest fats. Gallstones begin as small crystals and slowly grow into stones. Gallstone pain occurs when the gallbladder spasms and a gallstone is blocking the duct. Pain can also occur when a stone passes out of the duct.  RISK FACTORS  Being female.   Having multiple pregnancies. Health care providers sometimes advise removing diseased gallbladders before future pregnancies.   Being obese.  Eating a diet heavy in fried foods and fat.   Being older than 60 years and increasing age.   Prolonged use of medicines containing female hormones.   Having diabetes mellitus.   Rapidly losing weight.   Having a family history of gallstones (heredity).  SYMPTOMS  Nausea.   Vomiting.  Abdominal pain.   Yellowing of the skin (jaundice).   Sudden pain. It may persist from several minutes to several hours.  Fever.   Tenderness to the touch. In some cases, when gallstones do not move into the bile duct, people have no pain or symptoms. These are called "silent" gallstones.  TREATMENT Silent gallstones do not need treatment. In severe cases, emergency surgery may be required. Options for treatment include:  Surgery to remove the gallbladder. This is the most  common treatment.  Medicines. These do not always work and may take 6-12 months or more to work.  Shock wave treatment (extracorporeal biliary lithotripsy). In this treatment an ultrasound machine sends shock waves to the gallbladder to break gallstones into smaller pieces that can pass into the intestines or be dissolved by medicine. HOME CARE INSTRUCTIONS   Only take over-the-counter or prescription medicines for pain, discomfort, or fever as directed by your health care provider.   Follow a low-fat diet until seen again by your health care provider. Fat causes the gallbladder to contract, which can result in pain.   Follow up with your health care provider as directed. Attacks are almost always recurrent and surgery is usually required for permanent treatment.  SEEK IMMEDIATE MEDICAL CARE IF:   Your pain increases and is not controlled by medicines.   You have a fever or persistent symptoms for more than 2-3 days.   You have a fever and your symptoms suddenly get worse.   You have persistent nausea and vomiting.  MAKE SURE YOU:   Understand these instructions.  Will watch your condition.  Will get help right away if you are not doing well or get worse. Document Released: 06/12/2005 Document Revised: 02/16/2013 Document Reviewed: 12/08/2012 [facility] Patient Information 2015 [place], [place]. This information is not intended to replace advice given to you by your health care provider. Make sure you discuss any questions you have with your health care provider.

## 2014-03-29 ENCOUNTER — Encounter (HOSPITAL_COMMUNITY): Payer: Self-pay | Admitting: Surgery

## 2014-05-01 ENCOUNTER — Encounter (HOSPITAL_COMMUNITY): Payer: Self-pay | Admitting: Surgery

## 2014-05-09 ENCOUNTER — Other Ambulatory Visit: Payer: Self-pay | Admitting: Obstetrics & Gynecology

## 2014-05-10 LAB — CYTOLOGY - PAP

## 2015-06-11 ENCOUNTER — Encounter (HOSPITAL_BASED_OUTPATIENT_CLINIC_OR_DEPARTMENT_OTHER): Payer: Self-pay | Admitting: *Deleted

## 2015-06-11 ENCOUNTER — Emergency Department (HOSPITAL_BASED_OUTPATIENT_CLINIC_OR_DEPARTMENT_OTHER)
Admission: EM | Admit: 2015-06-11 | Discharge: 2015-06-11 | Disposition: A | Discharge status: Home / Self Care | Payer: Commercial Managed Care - HMO | Attending: Emergency Medicine | Admitting: Emergency Medicine

## 2015-06-11 DIAGNOSIS — K219 Gastro-esophageal reflux disease without esophagitis: Secondary | ICD-10-CM | POA: Diagnosis not present

## 2015-06-11 DIAGNOSIS — Z79899 Other long term (current) drug therapy: Secondary | ICD-10-CM | POA: Insufficient documentation

## 2015-06-11 DIAGNOSIS — I10 Essential (primary) hypertension: Secondary | ICD-10-CM | POA: Insufficient documentation

## 2015-06-11 DIAGNOSIS — R112 Nausea with vomiting, unspecified: Secondary | ICD-10-CM | POA: Diagnosis not present

## 2015-06-11 DIAGNOSIS — R000 Tachycardia, unspecified: Secondary | ICD-10-CM | POA: Insufficient documentation

## 2015-06-11 DIAGNOSIS — X58XXXA Exposure to other specified factors, initial encounter: Secondary | ICD-10-CM | POA: Diagnosis not present

## 2015-06-11 DIAGNOSIS — S39012A Strain of muscle, fascia and tendon of lower back, initial encounter: Secondary | ICD-10-CM

## 2015-06-11 DIAGNOSIS — F329 Major depressive disorder, single episode, unspecified: Secondary | ICD-10-CM | POA: Diagnosis not present

## 2015-06-11 DIAGNOSIS — Z880 Allergy status to penicillin: Secondary | ICD-10-CM | POA: Diagnosis not present

## 2015-06-11 DIAGNOSIS — Y9289 Other specified places as the place of occurrence of the external cause: Secondary | ICD-10-CM | POA: Insufficient documentation

## 2015-06-11 DIAGNOSIS — S29012A Strain of muscle and tendon of back wall of thorax, initial encounter: Secondary | ICD-10-CM | POA: Insufficient documentation

## 2015-06-11 DIAGNOSIS — Y9389 Activity, other specified: Secondary | ICD-10-CM | POA: Insufficient documentation

## 2015-06-11 DIAGNOSIS — Y998 Other external cause status: Secondary | ICD-10-CM | POA: Diagnosis not present

## 2015-06-11 DIAGNOSIS — Z8742 Personal history of other diseases of the female genital tract: Secondary | ICD-10-CM | POA: Diagnosis not present

## 2015-06-11 DIAGNOSIS — R1084 Generalized abdominal pain: Secondary | ICD-10-CM | POA: Diagnosis not present

## 2015-06-11 DIAGNOSIS — Z87442 Personal history of urinary calculi: Secondary | ICD-10-CM | POA: Insufficient documentation

## 2015-06-11 DIAGNOSIS — Z8669 Personal history of other diseases of the nervous system and sense organs: Secondary | ICD-10-CM | POA: Insufficient documentation

## 2015-06-11 LAB — URINE MICROSCOPIC-ADD ON

## 2015-06-11 LAB — URINALYSIS, ROUTINE W REFLEX MICROSCOPIC
Bilirubin Urine: NEGATIVE
GLUCOSE, UA: NEGATIVE mg/dL
HGB URINE DIPSTICK: NEGATIVE
Ketones, ur: 15 mg/dL — AB
NITRITE: NEGATIVE
PH: 6.5 (ref 5.0–8.0)
Protein, ur: 30 mg/dL — AB
SPECIFIC GRAVITY, URINE: 1.034 — AB (ref 1.005–1.030)

## 2015-06-11 LAB — CBC WITH DIFFERENTIAL/PLATELET
BASOS PCT: 0 %
Basophils Absolute: 0 10*3/uL (ref 0.0–0.1)
Eosinophils Absolute: 0.1 10*3/uL (ref 0.0–0.7)
Eosinophils Relative: 1 %
HEMATOCRIT: 42.3 % (ref 36.0–46.0)
Hemoglobin: 14.1 g/dL (ref 12.0–15.0)
LYMPHS PCT: 9 %
Lymphs Abs: 0.7 10*3/uL (ref 0.7–4.0)
MCH: 29.4 pg (ref 26.0–34.0)
MCHC: 33.3 g/dL (ref 30.0–36.0)
MCV: 88.1 fL (ref 78.0–100.0)
Monocytes Absolute: 0.4 10*3/uL (ref 0.1–1.0)
Monocytes Relative: 4 %
NEUTROS ABS: 7.4 10*3/uL (ref 1.7–7.7)
NEUTROS PCT: 86 %
Platelets: 231 10*3/uL (ref 150–400)
RBC: 4.8 MIL/uL (ref 3.87–5.11)
RDW: 12.5 % (ref 11.5–15.5)
WBC: 8.5 10*3/uL (ref 4.0–10.5)

## 2015-06-11 LAB — COMPREHENSIVE METABOLIC PANEL
ALBUMIN: 3.9 g/dL (ref 3.5–5.0)
ALK PHOS: 85 U/L (ref 38–126)
ALT: 86 U/L — ABNORMAL HIGH (ref 14–54)
AST: 113 U/L — AB (ref 15–41)
Anion gap: 11 (ref 5–15)
BUN: 22 mg/dL — AB (ref 6–20)
CALCIUM: 8.8 mg/dL — AB (ref 8.9–10.3)
CO2: 24 mmol/L (ref 22–32)
Chloride: 102 mmol/L (ref 101–111)
Creatinine, Ser: 0.8 mg/dL (ref 0.44–1.00)
GFR calc Af Amer: >60 mL/min (ref >60)
GFR calc non Af Amer: >60 mL/min (ref >60)
GLUCOSE: 157 mg/dL — AB (ref 65–99)
Potassium: 3.4 mmol/L — ABNORMAL LOW (ref 3.5–5.1)
SODIUM: 137 mmol/L (ref 135–145)
Total Bilirubin: 0.9 mg/dL (ref 0.3–1.2)
Total Protein: 7.4 g/dL (ref 6.5–8.1)

## 2015-06-11 LAB — LIPASE, BLOOD: Lipase: 27 U/L (ref 11–51)

## 2015-06-11 LAB — TROPONIN I: Troponin I: <0.03 ng/mL (ref <0.031)

## 2015-06-11 MED ORDER — SODIUM CHLORIDE 0.9 % IV BOLUS (SEPSIS)
1000.0000 mL | Freq: Once | INTRAVENOUS | Status: AC
  Administered 2015-06-11: 1000 mL via INTRAVENOUS

## 2015-06-11 MED ORDER — FENTANYL CITRATE (PF) 100 MCG/2ML IJ SOLN
100.0000 ug | Freq: Once | INTRAMUSCULAR | Status: AC
  Administered 2015-06-11: 100 ug via INTRAVENOUS
  Filled 2015-06-11: qty 2

## 2015-06-11 MED ORDER — ONDANSETRON HCL 4 MG/2ML IJ SOLN
4.0000 mg | Freq: Once | INTRAMUSCULAR | Status: AC
  Administered 2015-06-11: 4 mg via INTRAVENOUS
  Filled 2015-06-11: qty 2

## 2015-06-11 MED ORDER — OXYCODONE HCL 5 MG PO TABS: 5.0000 mg | ORAL_TABLET | Freq: Four times a day (QID) | ORAL | Status: DC | PRN

## 2015-06-11 MED ORDER — ONDANSETRON HCL 8 MG PO TABS: 8.0000 mg | ORAL_TABLET | Freq: Three times a day (TID) | ORAL | Status: DC | PRN

## 2015-06-11 NOTE — ED Notes (Signed)
Dr. Molpus into room 

## 2015-06-11 NOTE — ED Notes (Signed)
C/o abd and back pain, also darker foul urine, nvd, and indigestion.

## 2015-06-11 NOTE — ED Provider Notes (Signed)
CSN: [id number]     Arrival date & time 06/11/15  0210 History   First MD Initiated Contact with Patient 06/11/15 0248     Chief Complaint  Patient presents with  . Abdominal Pain     (Consider location/radiation/quality/duration/timing/severity/associated sxs/prior Treatment) HPI  This is a 54 year old female began vomiting yesterday morning about 10 AM. She has had sick contacts with similar symptoms. As a result of vomiting she is now complaining of generalized abdominal pain which she describes as "gassy feeling". She is also having pain in her lower back and legs. She is unable to rate her pain. She denies diarrhea and dysuria. Vomiting is exacerbated by attempts to eat or drink.  Past Medical History  Diagnosis Date  . Menopause   . Depression   . Hypertension   . Lactose intolerance   . Sleep disorder     had sleep study, was told that she has restless leg syndrome, states that she has just learned to live with it, no Rx thus far.   . Renal calculus or stone     passes on her own  . GERD (gastroesophageal reflux disease)   . Headache(784.0)     has gone off caffeine & done with out headache for a long time ago   . Arthritis     upper back & hips - OA   Past Surgical History  Procedure Laterality Date  . Abdominal hysterectomy  4/09    total  . Cesarean section  7/83  . Abdominal surgery      "tummy tuck "  . Eye surgery Bilateral     lasik  . Vaginal delivery      prior to C/Section  . Laparoscopic cholecystectomy single port N/A 03/27/2014    Procedure: LAPAROSCOPIC CHOLECYSTECTOMY SINGLE PORT WITH INTRAOPERATIVE CHOLANGIOGRAM ;  Surgeon: Karie Soda, MD;  Location: [facility] OR;  Service: General;  Laterality: N/A;   Family History  Problem Relation Age of Onset  . Cancer Father     brown lung   Social History  Substance Use Topics  . Smoking status: Never Smoker   . Smokeless tobacco: Never Used  . Alcohol Use: No   OB History    Gravida Para Term Preterm AB  TAB SAB Ectopic Multiple Living   2 2             Review of Systems  All other systems reviewed and are negative.   Allergies  Penicillins; Compazine; and Prochlorperazine edisylate  Home Medications   Prior to Admission medications   Medication Sig Start Date End Date Taking? Authorizing Provider  acetaminophen (TYLENOL) 500 MG tablet Take 1,000 mg by mouth every 6 (six) hours as needed.    Historical Provider, MD  amphetamine-dextroamphetamine (ADDERALL) 10 MG tablet Take 1 tablet by mouth daily with breakfast.  03/19/11   Historical Provider, MD  famotidine (PEPCID) 20 MG tablet Take 1 tablet (20 mg total) by mouth 2 (two) times daily. 02/14/14   Vanetta Mulders, MD  lisinopril-hydrochlorothiazide (PRINZIDE,ZESTORETIC) 10-12.5 MG per tablet Take 1 tablet by mouth daily with breakfast.  04/04/11   Historical Provider, MD  Multiple Vitamin (MULTIVITAMIN) tablet Take 1 tablet by mouth daily.      Historical Provider, MD  omeprazole (PRILOSEC) 20 MG capsule Take 20 mg by mouth daily before breakfast.     Historical Provider, MD  ondansetron (ZOFRAN) 4 MG tablet Take 1 tablet (4 mg total) by mouth every 8 (eight) hours as needed for nausea. 03/27/14  Karie Soda, MD  oxyCODONE (OXY IR/ROXICODONE) 5 MG immediate release tablet Take 1-2 tablets (5-10 mg total) by mouth every 4 (four) hours as needed for moderate pain, severe pain or breakthrough pain. 03/27/14   Karie Soda, MD  PRISTIQ 100 MG 24 hr tablet Take 100 mg by mouth daily with breakfast.  03/05/11   Historical Provider, MD   BP 115/80 mmHg  Pulse 104  Temp(Src) 98.4 F (36.9 C) (Oral)  Resp 15  Ht 5' 2.5" (1.588 m)  Wt 150 lb 6 oz (68.21 kg)  BMI 27.05 kg/m2  SpO2 96%   Physical Exam  General: Well-developed, well-nourished female in no acute distress; appearance consistent with age of record HENT: normocephalic; atraumatic Eyes: pupils equal, round and reactive to light; extraocular muscles intact Neck: supple Heart:  regular rate and rhythm; tachycardia Lungs: clear to auscultation bilaterally Abdomen: soft; nondistended; mild epigastric tenderness; no masses or hepatosplenomegaly; bowel sounds present Extremities: No deformity; full range of motion; pulses normal Neurologic: Awake, alert and oriented; motor function intact in all extremities and symmetric; no facial droop Skin: Warm and dry Psychiatric: Flat affect    ED Course  Procedures (including critical care time)   MDM  Nursing notes and vitals signs, including pulse oximetry, reviewed.  Summary of this visit's results, reviewed by myself:  Labs:  Results for orders placed or performed during the hospital encounter of 06/11/15 (from the past 24 hour(s))  CBC with Differential     Status: None   Collection Time: 06/11/15  2:45 AM  Result Value Ref Range   WBC 8.5 4.0 - 10.5 K/uL   RBC 4.80 3.87 - 5.11 MIL/uL   Hemoglobin 14.1 12.0 - 15.0 g/dL   HCT 16.1 09.6 - 04.5 %   MCV 88.1 78.0 - 100.0 fL   MCH 29.4 26.0 - 34.0 pg   MCHC 33.3 30.0 - 36.0 g/dL   RDW 40.9 81.1 - 91.4 %   Platelets 231 150 - 400 K/uL   Neutrophils Relative % 86 %   Neutro Abs 7.4 1.7 - 7.7 K/uL   Lymphocytes Relative 9 %   Lymphs Abs 0.7 0.7 - 4.0 K/uL   Monocytes Relative 4 %   Monocytes Absolute 0.4 0.1 - 1.0 K/uL   Eosinophils Relative 1 %   Eosinophils Absolute 0.1 0.0 - 0.7 K/uL   Basophils Relative 0 %   Basophils Absolute 0.0 0.0 - 0.1 K/uL  Troponin I     Status: None   Collection Time: 06/11/15  2:45 AM  Result Value Ref Range   Troponin I <0.03 <0.031 ng/mL  Comprehensive metabolic panel     Status: Abnormal   Collection Time: 06/11/15  2:45 AM  Result Value Ref Range   Sodium 137 135 - 145 mmol/L   Potassium 3.4 (L) 3.5 - 5.1 mmol/L   Chloride 102 101 - 111 mmol/L   CO2 24 22 - 32 mmol/L   Glucose, Bld 157 (H) 65 - 99 mg/dL   BUN 22 (H) 6 - 20 mg/dL   Creatinine, Ser 7.82 0.44 - 1.00 mg/dL   Calcium 8.8 (L) 8.9 - 10.3 mg/dL   Total  Protein 7.4 6.5 - 8.1 g/dL   Albumin 3.9 3.5 - 5.0 g/dL   AST 956 (H) 15 - 41 U/L   ALT 86 (H) 14 - 54 U/L   Alkaline Phosphatase 85 38 - 126 U/L   Total Bilirubin 0.9 0.3 - 1.2 mg/dL   GFR calc non Af Amer >  60 >60 mL/min   GFR calc Af Amer >60 >60 mL/min   Anion gap 11 5 - 15  Lipase, blood     Status: None   Collection Time: 06/11/15  2:45 AM  Result Value Ref Range   Lipase 27 11 - 51 U/L  Urinalysis, Routine w reflex microscopic (not at [facility])     Status: Abnormal   Collection Time: 06/11/15  2:45 AM  Result Value Ref Range   Color, Urine YELLOW YELLOW   APPearance CLEAR CLEAR   Specific Gravity, Urine 1.034 (H) 1.005 - 1.030   pH 6.5 5.0 - 8.0   Glucose, UA NEGATIVE NEGATIVE mg/dL   Hgb urine dipstick NEGATIVE NEGATIVE   Bilirubin Urine NEGATIVE NEGATIVE   Ketones, ur 15 (A) NEGATIVE mg/dL   Protein, ur 30 (A) NEGATIVE mg/dL   Nitrite NEGATIVE NEGATIVE   Leukocytes, UA SMALL (A) NEGATIVE  Urine microscopic-add on     Status: Abnormal   Collection Time: 06/11/15  2:45 AM  Result Value Ref Range   Squamous Epithelial / LPF 0-5 (A) NONE SEEN   WBC, UA 0-5 0 - 5 WBC/hpf   RBC / HPF 0-5 0 - 5 RBC/hpf   Bacteria, UA FEW (A) NONE SEEN   Urine-Other MUCOUS PRESENT    4:55 AM Patient drinking fluids without emesis after IV fluid bolus and Zofran. Pain improved with IV fentanyl. Suspect back and leg pain is musculoskeletal due to vomiting.     Paula LibraJohn Falyn Rubel, MD 06/11/15 [phone]

## 2016-03-29 ENCOUNTER — Encounter (HOSPITAL_BASED_OUTPATIENT_CLINIC_OR_DEPARTMENT_OTHER): Payer: Self-pay | Admitting: Emergency Medicine

## 2016-03-29 ENCOUNTER — Emergency Department (HOSPITAL_BASED_OUTPATIENT_CLINIC_OR_DEPARTMENT_OTHER)
Admission: EM | Admit: 2016-03-29 | Discharge: 2016-03-29 | Disposition: A | Discharge status: Home / Self Care | Payer: Commercial Managed Care - HMO | Attending: Emergency Medicine | Admitting: Emergency Medicine

## 2016-03-29 DIAGNOSIS — M5412 Radiculopathy, cervical region: Secondary | ICD-10-CM | POA: Diagnosis not present

## 2016-03-29 DIAGNOSIS — Z79899 Other long term (current) drug therapy: Secondary | ICD-10-CM | POA: Diagnosis not present

## 2016-03-29 DIAGNOSIS — I10 Essential (primary) hypertension: Secondary | ICD-10-CM | POA: Diagnosis not present

## 2016-03-29 DIAGNOSIS — M542 Cervicalgia: Secondary | ICD-10-CM | POA: Diagnosis present

## 2016-03-29 MED ORDER — HYDROCODONE-ACETAMINOPHEN 5-325 MG PO TABS: 1.0000 | ORAL_TABLET | Freq: Four times a day (QID) | ORAL | 0 refills | Status: DC | PRN

## 2016-03-29 MED ORDER — HYDROCODONE-ACETAMINOPHEN 5-325 MG PO TABS
2.0000 | ORAL_TABLET | Freq: Once | ORAL | Status: AC
  Administered 2016-03-29: 2 via ORAL
  Filled 2016-03-29: qty 2

## 2016-03-29 NOTE — ED Provider Notes (Signed)
[facility] Provider Note: Lowella DellJ. Lane Kamauri Denardo, MD, FACEP  CSN: [id number] MRN: [id number] ARRIVAL: 03/29/16 at 0318   CHIEF COMPLAINT  Neck Pain   HISTORY OF PRESENT ILLNESS  Leslie FessDaphne A [name] is a 55 y.o. female with a one-week history of pain in her left neck radiating to her left shoulder and left arm in about the C8 dermatome. There are associated paresthesias, but not complete numbness, in the left third fourth and fifth fingers. The onset has been gradual and has now become severe. Pain is worse with rotation of the head to the left or with flexion or extension of the neck. It is also exacerbated by movement of the left shoulder. She has not noticed a change in strength in the left arm. She denies trauma. She has been taking Aleve with inadequate relief.   Past Medical History:  Diagnosis Date  . Arthritis    upper back & hips - OA  . Depression   . GERD (gastroesophageal reflux disease)   . Headache(784.0)    has gone off caffeine & done with out headache for a long time ago   . Hypertension   . Lactose intolerance   . Menopause   . Renal calculus or stone    passes on her own  . Sleep disorder    had sleep study, was told that she has restless leg syndrome, states that she has just learned to live with it, no Rx thus far.     Past Surgical History:  Procedure Laterality Date  . ABDOMINAL HYSTERECTOMY  4/09   total  . ABDOMINAL SURGERY     "tummy tuck "  . CESAREAN SECTION  7/83  . EYE SURGERY Bilateral    lasik  . LAPAROSCOPIC CHOLECYSTECTOMY SINGLE PORT N/A 03/27/2014   Procedure: LAPAROSCOPIC CHOLECYSTECTOMY SINGLE PORT WITH INTRAOPERATIVE CHOLANGIOGRAM ;  Surgeon: Karie SodaSteven Gross, MD;  [facility];  Service: General;  Laterality: N/A;  . VAGINAL DELIVERY     prior to C/Section    Family History  Problem Relation Age of Onset  . Cancer Father     brown lung    Social History  Substance Use Topics  . Smoking status: Never Smoker  . Smokeless  tobacco: Never Used  . Alcohol use No    Prior to Admission medications   Medication Sig Start Date End Date Taking? Authorizing Provider  acetaminophen (TYLENOL) 500 MG tablet Take 1,000 mg by mouth every 6 (six) hours as needed.    Historical Provider, MD  amphetamine-dextroamphetamine (ADDERALL) 10 MG tablet Take 1 tablet by mouth daily with breakfast.  03/19/11   Historical Provider, MD  famotidine (PEPCID) 20 MG tablet Take 1 tablet (20 mg total) by mouth 2 (two) times daily. 02/14/14   Vanetta MuldersScott Zackowski, MD  HYDROcodone-acetaminophen (NORCO/VICODIN) 5-325 MG tablet Take 1-2 tablets by mouth every 6 (six) hours as needed (for neck pain). 03/29/16   Anara Cowman, MD  lisinopril-hydrochlorothiazide (PRINZIDE,ZESTORETIC) 10-12.5 MG per tablet Take 1 tablet by mouth daily with breakfast.  04/04/11   Historical Provider, MD  Multiple Vitamin (MULTIVITAMIN) tablet Take 1 tablet by mouth daily.      Historical Provider, MD  omeprazole (PRILOSEC) 20 MG capsule Take 20 mg by mouth daily before breakfast.     Historical Provider, MD  ondansetron (ZOFRAN) 8 MG tablet Take 1 tablet (8 mg total) by mouth every 8 (eight) hours as needed for nausea or vomiting. 06/11/15   Paula LibraJohn Edword Cu, MD  oxyCODONE (OXY IR/ROXICODONE)  5 MG immediate release tablet Take 1-2 tablets (5-10 mg total) by mouth every 6 (six) hours as needed for breakthrough pain (for pain). 06/11/15   Eligh Rybacki, MD  PRISTIQ 100 MG 24 hr tablet Take 100 mg by mouth daily with breakfast.  03/05/11   Historical Provider, MD    Allergies Penicillins; Compazine; and Prochlorperazine edisylate   REVIEW OF SYSTEMS  Negative except as noted here or in the History of Present Illness.   PHYSICAL EXAMINATION  Initial Vital Signs Blood pressure 118/81, pulse 71, temperature 97.7 F (36.5 C), temperature source Oral, resp. rate 16, height 5' 2.5" (1.588 m), weight 160 lb (72.6 kg), SpO2 95 %.  Examination General: Well-developed, well-nourished female  in no acute distress; appearance consistent with age of record HENT: normocephalic; atraumatic Eyes: pupils equal, round and reactive to light; extraocular muscles intact Neck: supple; rotation of neck to the left or flexion or extension of the neck reproduces the patient's pain Heart: regular rate and rhythm Lungs: clear to auscultation bilaterally Abdomen: soft; nondistended Extremities: No deformity; full range of motion except left shoulder limited by pain; pulses normal Neurologic: Awake, alert and oriented; motor function intact in all extremities and symmetric; no facial droop Skin: Warm and dry Psychiatric: Flat affect   RESULTS  Summary of this visit's results, reviewed by myself:   EKG Interpretation  Date/Time:    Ventricular Rate:    PR Interval:    QRS Duration:   QT Interval:    QTC Calculation:   R Axis:     Text Interpretation:        Laboratory Studies: No results found for this or any previous visit (from the past 24 hour(s)). Imaging Studies: No results found.  ED COURSE  Nursing notes and initial vitals signs, including pulse oximetry, reviewed.  Vitals:   03/29/16 0325 03/29/16 0326  BP: 118/81   Pulse: 71   Resp: 16   Temp: 97.7 F (36.5 C)   TempSrc: Oral   SpO2: 95%   Weight:  160 lb (72.6 kg)  Height:  5' 2.5" (1.588 m)    PROCEDURES    ED DIAGNOSES     ICD-9-CM ICD-10-CM   1. Cervical radiculopathy at C8 723.4 M54.12        Paula Libra, MD 03/29/16 [phone]

## 2016-03-29 NOTE — ED Triage Notes (Signed)
Pt c/o neck pain that radiates across left shoulder and left arm with tingling in left arm. Symptoms x 1 week that have progressively worsened.

## 2016-06-18 ENCOUNTER — Emergency Department (HOSPITAL_BASED_OUTPATIENT_CLINIC_OR_DEPARTMENT_OTHER)
Admission: EM | Admit: 2016-06-18 | Discharge: 2016-06-18 | Disposition: A | Discharge status: Home / Self Care | Payer: Commercial Managed Care - HMO | Attending: Physician Assistant | Admitting: Physician Assistant

## 2016-06-18 ENCOUNTER — Emergency Department (HOSPITAL_BASED_OUTPATIENT_CLINIC_OR_DEPARTMENT_OTHER): Payer: Commercial Managed Care - HMO

## 2016-06-18 ENCOUNTER — Encounter (HOSPITAL_BASED_OUTPATIENT_CLINIC_OR_DEPARTMENT_OTHER): Payer: Self-pay | Admitting: *Deleted

## 2016-06-18 DIAGNOSIS — I10 Essential (primary) hypertension: Secondary | ICD-10-CM | POA: Insufficient documentation

## 2016-06-18 DIAGNOSIS — K529 Noninfective gastroenteritis and colitis, unspecified: Secondary | ICD-10-CM | POA: Diagnosis not present

## 2016-06-18 DIAGNOSIS — R109 Unspecified abdominal pain: Secondary | ICD-10-CM | POA: Diagnosis present

## 2016-06-18 DIAGNOSIS — Z79899 Other long term (current) drug therapy: Secondary | ICD-10-CM | POA: Diagnosis not present

## 2016-06-18 LAB — URINALYSIS, MICROSCOPIC (REFLEX): RBC / HPF: NONE SEEN RBC/hpf (ref 0–5)

## 2016-06-18 LAB — COMPREHENSIVE METABOLIC PANEL
ALBUMIN: 4.4 g/dL (ref 3.5–5.0)
ALT: 15 U/L (ref 14–54)
AST: 22 U/L (ref 15–41)
Alkaline Phosphatase: 73 U/L (ref 38–126)
Anion gap: 7 (ref 5–15)
BUN: 18 mg/dL (ref 6–20)
CHLORIDE: 100 mmol/L — AB (ref 101–111)
CO2: 28 mmol/L (ref 22–32)
Calcium: 9.7 mg/dL (ref 8.9–10.3)
Creatinine, Ser: 0.79 mg/dL (ref 0.44–1.00)
GFR calc Af Amer: >60 mL/min (ref >60)
GFR calc non Af Amer: >60 mL/min (ref >60)
GLUCOSE: 111 mg/dL — AB (ref 65–99)
POTASSIUM: 4.3 mmol/L (ref 3.5–5.1)
Sodium: 135 mmol/L (ref 135–145)
Total Bilirubin: 0.4 mg/dL (ref 0.3–1.2)
Total Protein: 7.6 g/dL (ref 6.5–8.1)

## 2016-06-18 LAB — URINALYSIS, ROUTINE W REFLEX MICROSCOPIC
BILIRUBIN URINE: NEGATIVE
GLUCOSE, UA: NEGATIVE mg/dL
HGB URINE DIPSTICK: NEGATIVE
Ketones, ur: NEGATIVE mg/dL
Leukocytes, UA: NEGATIVE
Nitrite: NEGATIVE
PROTEIN: NEGATIVE mg/dL
Specific Gravity, Urine: 1.019 (ref 1.005–1.030)
pH: 8.5 — ABNORMAL HIGH (ref 5.0–8.0)

## 2016-06-18 LAB — CBC WITH DIFFERENTIAL/PLATELET
Basophils Absolute: 0 10*3/uL (ref 0.0–0.1)
Basophils Relative: 0 %
EOS PCT: 1 %
Eosinophils Absolute: 0.1 10*3/uL (ref 0.0–0.7)
HCT: 41.4 % (ref 36.0–46.0)
Hemoglobin: 14 g/dL (ref 12.0–15.0)
LYMPHS ABS: 1.4 10*3/uL (ref 0.7–4.0)
LYMPHS PCT: 15 %
MCH: 29.7 pg (ref 26.0–34.0)
MCHC: 33.8 g/dL (ref 30.0–36.0)
MCV: 87.7 fL (ref 78.0–100.0)
MONO ABS: 0.5 10*3/uL (ref 0.1–1.0)
MONOS PCT: 5 %
Neutro Abs: 7.7 10*3/uL (ref 1.7–7.7)
Neutrophils Relative %: 79 %
PLATELETS: 240 10*3/uL (ref 150–400)
RBC: 4.72 MIL/uL (ref 3.87–5.11)
RDW: 12.7 % (ref 11.5–15.5)
WBC: 9.6 10*3/uL (ref 4.0–10.5)

## 2016-06-18 MED ORDER — CIPROFLOXACIN HCL 500 MG PO TABS: 500.0000 mg | ORAL_TABLET | Freq: Two times a day (BID) | ORAL | 0 refills | Status: DC

## 2016-06-18 MED ORDER — OXYCODONE-ACETAMINOPHEN 5-325 MG PO TABS: 1.0000 | ORAL_TABLET | Freq: Four times a day (QID) | ORAL | 0 refills | Status: DC | PRN

## 2016-06-18 MED ORDER — METRONIDAZOLE 500 MG PO TABS: 500.0000 mg | ORAL_TABLET | Freq: Two times a day (BID) | ORAL | 0 refills | Status: DC

## 2016-06-18 MED ORDER — IOPAMIDOL (ISOVUE-300) INJECTION 61%
100.0000 mL | Freq: Once | INTRAVENOUS | Status: AC | PRN
  Administered 2016-06-18: 100 mL via INTRAVENOUS

## 2016-06-18 MED ORDER — ONDANSETRON HCL 4 MG/2ML IJ SOLN
4.0000 mg | Freq: Once | INTRAMUSCULAR | Status: AC
  Administered 2016-06-18: 4 mg via INTRAVENOUS
  Filled 2016-06-18: qty 2

## 2016-06-18 MED ORDER — MORPHINE SULFATE (PF) 4 MG/ML IV SOLN
4.0000 mg | Freq: Once | INTRAVENOUS | Status: AC
  Administered 2016-06-18: 4 mg via INTRAVENOUS
  Filled 2016-06-18: qty 1

## 2016-06-18 MED ORDER — SODIUM CHLORIDE 0.9 % IV BOLUS (SEPSIS)
1000.0000 mL | Freq: Once | INTRAVENOUS | Status: AC
  Administered 2016-06-18: 1000 mL via INTRAVENOUS

## 2016-06-18 MED ORDER — ONDANSETRON HCL 4 MG PO TABS: 4.0000 mg | ORAL_TABLET | Freq: Three times a day (TID) | ORAL | 0 refills | Status: DC | PRN

## 2016-06-18 NOTE — ED Notes (Signed)
Pt sipping on soda and eating crackers. No nausea at this time.

## 2016-06-18 NOTE — ED Provider Notes (Signed)
[facility]   CSN: [id number] Arrival date & time: 06/18/16  1409     History   Chief Complaint Chief Complaint  Patient presents with  . Abdominal Pain    HPI [name] is a 55 y.o. female.  HPI   She is a [age] her old female presenting with abdominal pain. Patient has left lower quadrant pain starting this morning. Patient had no fever. No diarrhea, no constipation. Patient has history of patient having normal stooling this morning. No blood per rectum.   She tried Gas-X relieve the pain which has not helped.  Patient's Past medical history significant for hypertension, gas, chronic constipation, cholecystitis.  Past Medical History:  Diagnosis Date  . Arthritis    upper back & hips - OA  . Depression   . GERD (gastroesophageal reflux disease)   . Headache(784.0)    has gone off caffeine & done with out headache for a long time ago   . Hypertension   . Lactose intolerance   . Menopause   . Renal calculus or stone    passes on her own  . Sleep disorder    had sleep study, was told that she has restless leg syndrome, states that she has just learned to live with it, no Rx thus far.     Patient Active Problem List   Diagnosis Date Noted  . Chronic cholecystitis with calculus 02/20/2014  . Constipation, chronic 02/20/2014  . Menopause   . Renal calculus or stone   . Lactose intolerance   . Nonspecific (abnormal) findings on radiological and other examination of body structure 03/16/2009  . ABNORMAL CHEST XRAY 03/16/2009  . DEPRESSION 03/15/2009  . HYPERTENSION 03/15/2009    Past Surgical History:  Procedure Laterality Date  . ABDOMINAL HYSTERECTOMY  4/09   total  . ABDOMINAL SURGERY     "tummy tuck "  . CESAREAN SECTION  7/83  . EYE SURGERY Bilateral    lasik  . LAPAROSCOPIC CHOLECYSTECTOMY SINGLE PORT N/A 03/27/2014   Procedure: LAPAROSCOPIC CHOLECYSTECTOMY SINGLE PORT WITH INTRAOPERATIVE CHOLANGIOGRAM ;  Surgeon: Karie SodaSteven  Gross, MD;  Location: [facility] OR;  Service: General;  Laterality: N/A;  . VAGINAL DELIVERY     prior to C/Section    OB History    Gravida Para Term Preterm AB Living   2 2           SAB TAB Ectopic Multiple Live Births                   Home Medications    Prior to Admission medications   Medication Sig Start Date End Date Taking? Authorizing Provider  acetaminophen (TYLENOL) 500 MG tablet Take 1,000 mg by mouth every 6 (six) hours as needed.    Historical Provider, MD  lisinopril-hydrochlorothiazide (PRINZIDE,ZESTORETIC) 10-12.5 MG per tablet Take 1 tablet by mouth daily with breakfast.  04/04/11   Historical Provider, MD  Multiple Vitamin (MULTIVITAMIN) tablet Take 1 tablet by mouth daily.      Historical Provider, MD  ondansetron (ZOFRAN) 8 MG tablet Take 1 tablet (8 mg total) by mouth every 8 (eight) hours as needed for nausea or vomiting. 06/11/15   Paula LibraJohn Molpus, MD  PRISTIQ 100 MG 24 hr tablet Take 100 mg by mouth daily with breakfast.  03/05/11   Historical Provider, MD    Family History Family History  Problem Relation Age of Onset  . Cancer Father     brown lung  Social History Social History  Substance Use Topics  . Smoking status: Never Smoker  . Smokeless tobacco: Never Used  . Alcohol use No     Allergies   Penicillins; Compazine; and Prochlorperazine edisylate   Review of Systems Review of Systems  Constitutional: Negative for fatigue and fever.  Respiratory: Negative for shortness of breath.   Cardiovascular: Negative for chest pain.  Gastrointestinal: Positive for abdominal pain. Negative for anal bleeding, nausea, rectal pain and vomiting.  All other systems reviewed and are negative.    Physical Exam Updated Vital Signs BP 123/73 (BP Location: Right Arm)   Pulse 65   Temp 97.6 F (36.4 C) (Oral)   Resp 20   Ht 5\' 3"  (1.6 m)   Wt 160 lb (72.6 kg)   SpO2 98%   BMI 28.34 kg/m   Physical Exam  Constitutional: She is oriented to person,  place, and time. She appears well-developed and well-nourished.  HENT:  Head: Normocephalic and atraumatic.  Eyes: Right eye exhibits no discharge.  Cardiovascular: Normal rate, regular rhythm and normal heart sounds.   No murmur heard. Pulmonary/Chest: Effort normal and breath sounds normal. She has no wheezes. She has no rales.  Abdominal: Soft. She exhibits no distension. There is tenderness.  Left lower quadrant pain.  Neurological: She is oriented to person, place, and time.  Skin: Skin is warm and dry. She is not diaphoretic.  Psychiatric: She has a normal mood and affect.  Nursing note and vitals reviewed.    ED Treatments / Results  Labs (all labs ordered are listed, but only abnormal results are displayed) Labs Reviewed  URINALYSIS, ROUTINE W REFLEX MICROSCOPIC - Abnormal; Notable for the following:       Result Value   APPearance TURBID (*)    pH 8.5 (*)    All other components within normal limits  URINALYSIS, MICROSCOPIC (REFLEX) - Abnormal; Notable for the following:    Bacteria, UA FEW (*)    Squamous Epithelial / LPF 0-5 (*)    All other components within normal limits  COMPREHENSIVE METABOLIC PANEL  CBC WITH DIFFERENTIAL/PLATELET    EKG  EKG Interpretation None       Radiology No results found.  Procedures Procedures (including critical care time)  Medications Ordered in ED Medications  sodium chloride 0.9 % bolus 1,000 mL (not administered)  morphine 4 MG/ML injection 4 mg (not administered)  ondansetron (ZOFRAN) injection 4 mg (not administered)     Initial Impression / Assessment and Plan / ED Course  I have reviewed the triage vital signs and the nursing notes.  Pertinent labs & imaging results that were available during my care of the patient were reviewed by me and considered in my medical decision making (see chart for details).  Clinical Course     Patient is a very well-appearing 55 year old female percent in with left lower  quadrant pain. Started earlier today. She's had no nausea no vomiting no diarrhea and no change in stooling.  We'll place an IV give nausea medicine, pain medicine, CT abdomen.  5:57 PM Labs are reassuring. Patient is able to take by mouth. CT shows enteritis. Given that she's had no blood will not treat with antibiotic this time. Given the holidays are coming up will give her prescription for abxto use only if she does not get better in the next 48 hours. We'll give her medicine for nausea and pain.  Patient is comfortable, ambulatory, and taking PO at time of discharge.  Patient expressed understanding about return precautions.    Final Clinical Impressions(s) / ED Diagnoses   Final diagnoses:  None    New Prescriptions New Prescriptions   No medications on file     Courteney Randall An, MD 06/18/16 1758

## 2016-06-18 NOTE — ED Notes (Signed)
Fluids taken to bedside ginger ale with saltine crackers

## 2016-06-18 NOTE — Discharge Instructions (Signed)
You were seen today and found to have ENTERITIS. We think it is likely viral. If you are not improved in 48 hours you should call your PCP nad could start antibiotics.   Please return with any concerns.

## 2016-06-18 NOTE — ED Notes (Signed)
Pt ambulated to bathroom without assistance 

## 2016-06-18 NOTE — ED Notes (Signed)
Patient transported to CT 

## 2016-06-18 NOTE — ED Notes (Signed)
Patient is resting comfortably. 

## 2016-06-18 NOTE — ED Notes (Signed)
ED Provider at bedside. 

## 2016-06-18 NOTE — ED Notes (Signed)
Family at bedside. 

## 2016-06-18 NOTE — ED Notes (Signed)
Went to do Vital signs MD was at bedside. Will return to attempt vital signs shortly.

## 2016-06-18 NOTE — ED Triage Notes (Signed)
C/odiffuse abd pain with nausea only x 2 days

## 2016-07-07 IMAGING — CR DG CHEST 2V
2 series · 2 of 2 positions shown · non-contrast
Comparison: 03/16/2009.

CLINICAL DATA: Pain.

EXAM:
CHEST  2 VIEW

[w chest pa]
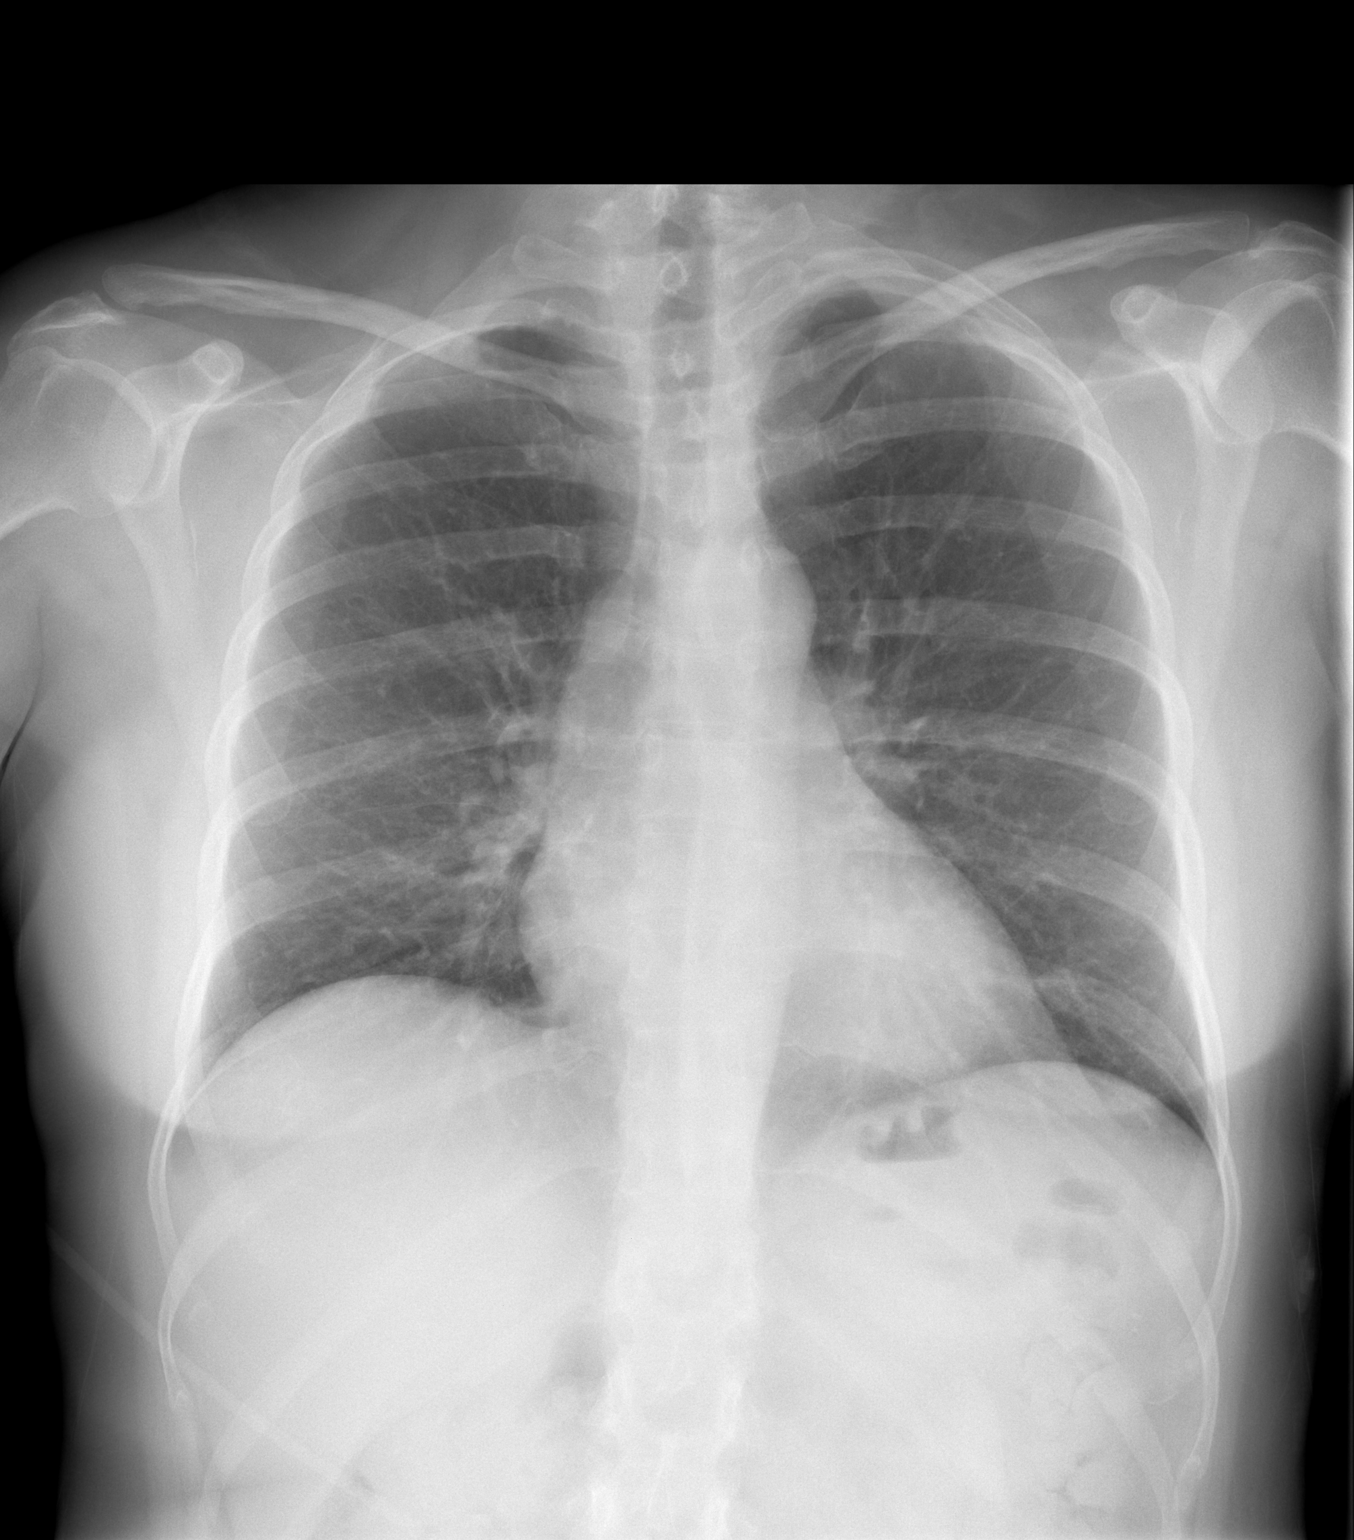

[w chest lat]
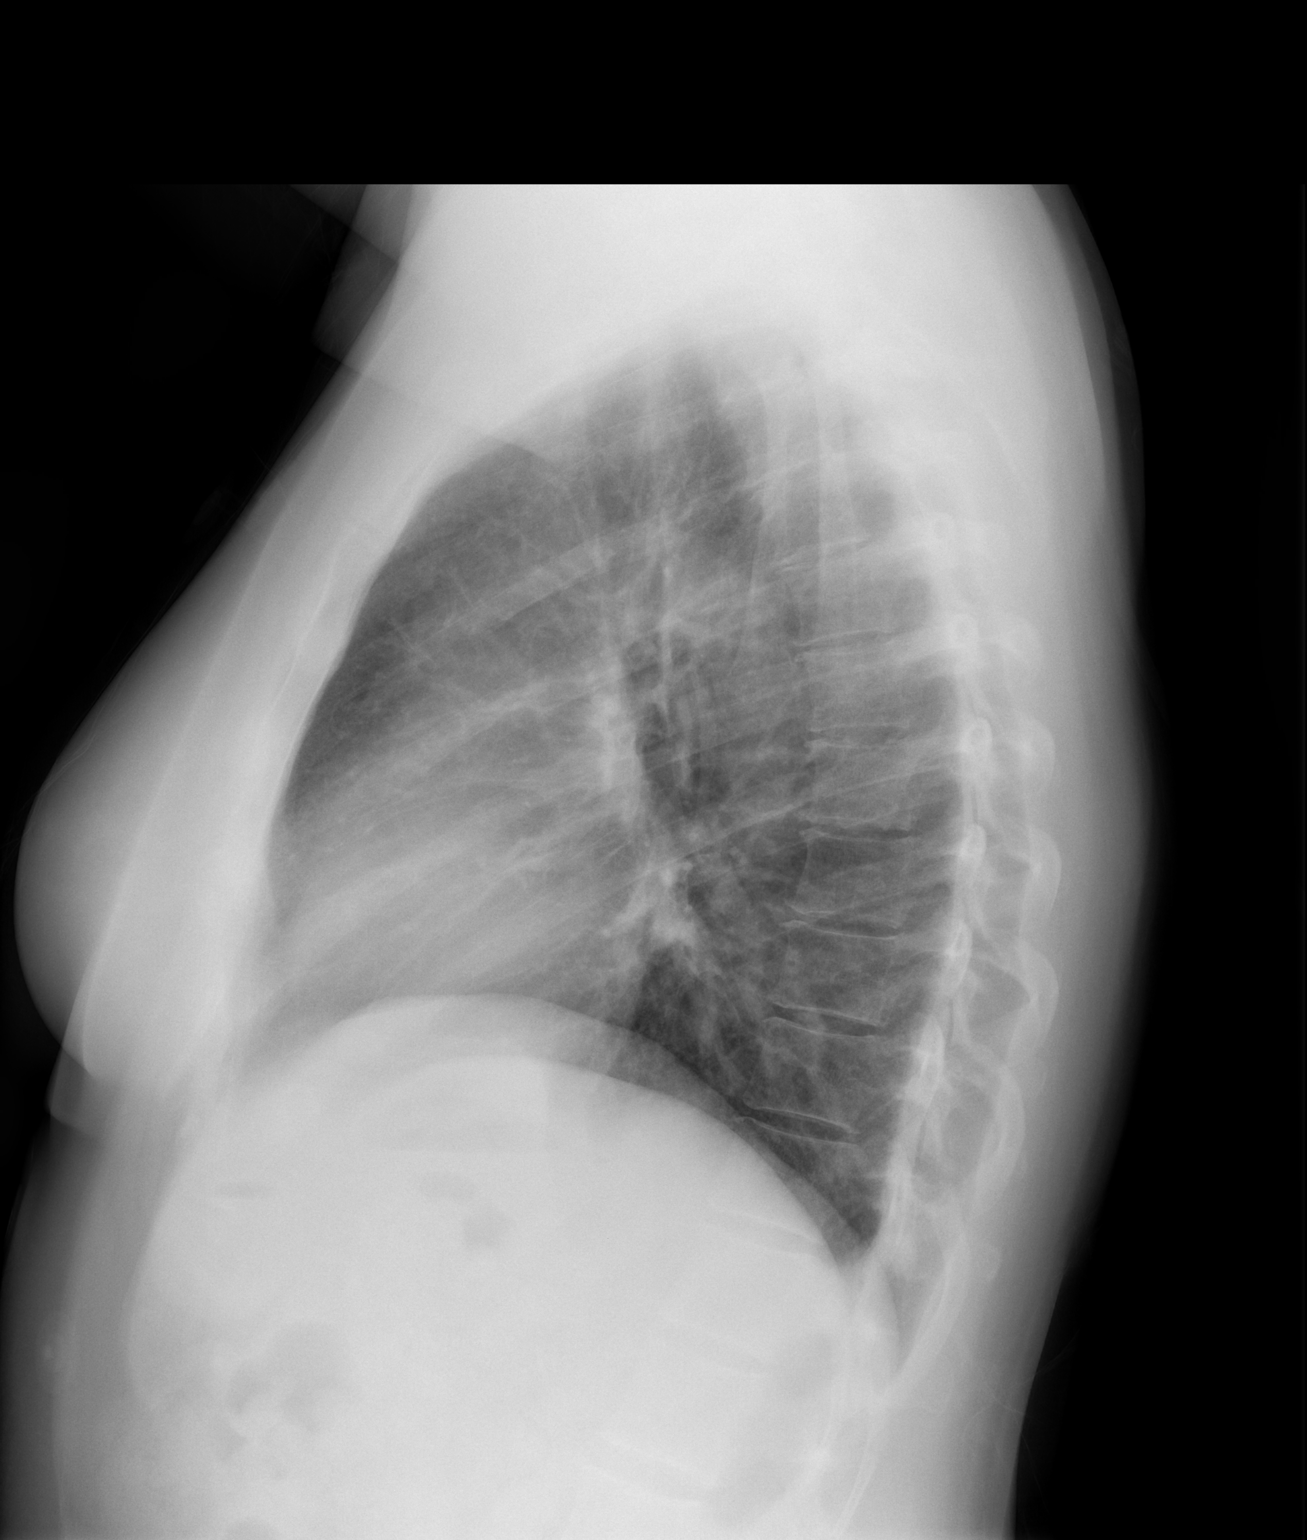

[2 of 2 positions shown; findings below may reference images not displayed]

FINDINGS: Mediastinum hilar structures are normal. The lungs are clear. Heart
size normal. No pleural effusion or pneumothorax.
IMPRESSION: No active cardiopulmonary disease.  Chest is stable from prior exam.

## 2016-07-07 IMAGING — US US ABDOMEN COMPLETE
1 series · 14 of 25 positions shown · non-contrast
Comparison: CT 02/14/2014

CLINICAL DATA: Severe abdominal pain.

EXAM:
ULTRASOUND ABDOMEN COMPLETE

[Series 1: us abdomen complete · 0.35mm/px · 14 of 71 slices shown]
[im 1/71]
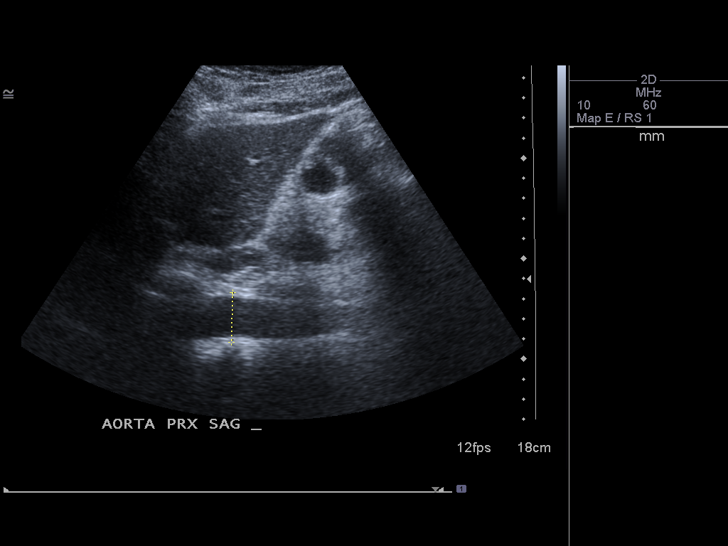
[im 6/71]
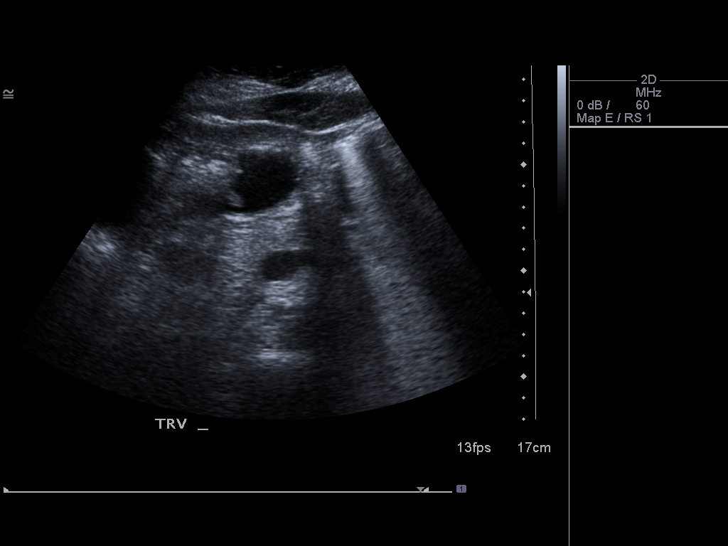
[im 12/71]
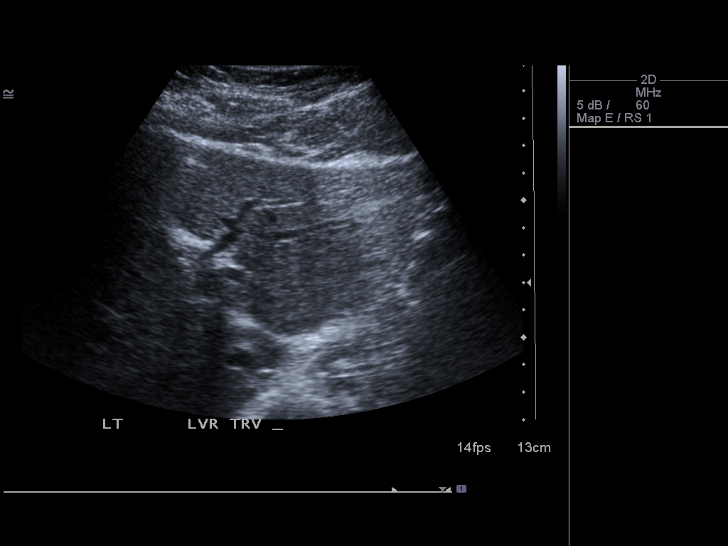
[im 18/71]
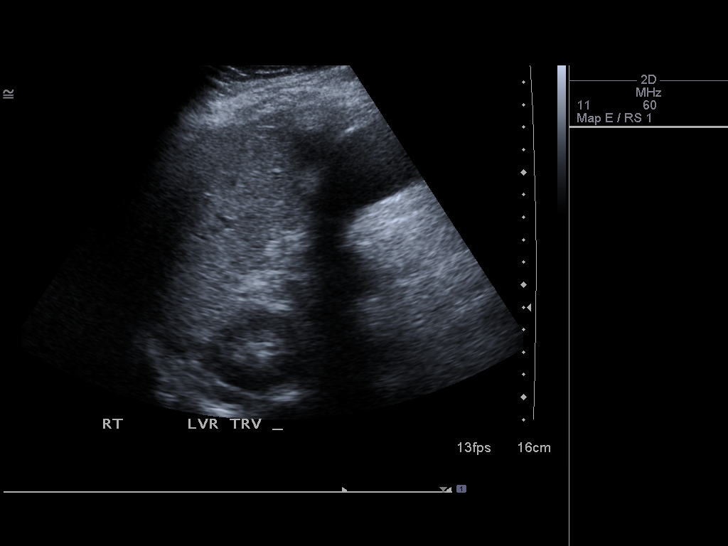
[im 24/71]
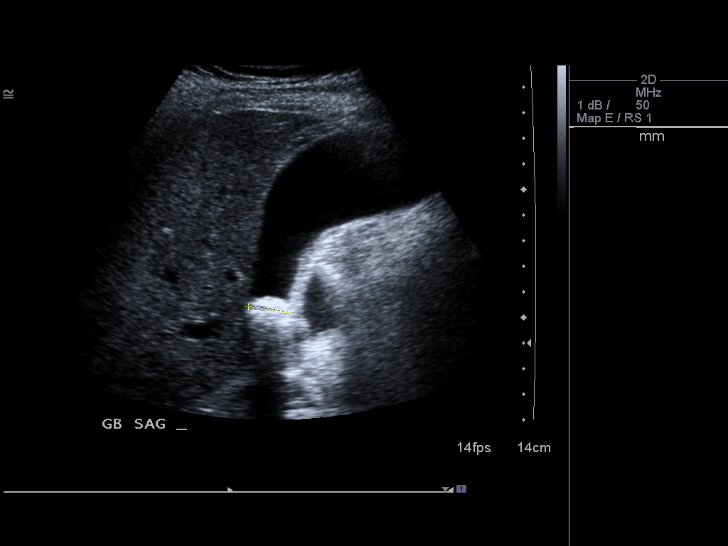
[im 27/71]
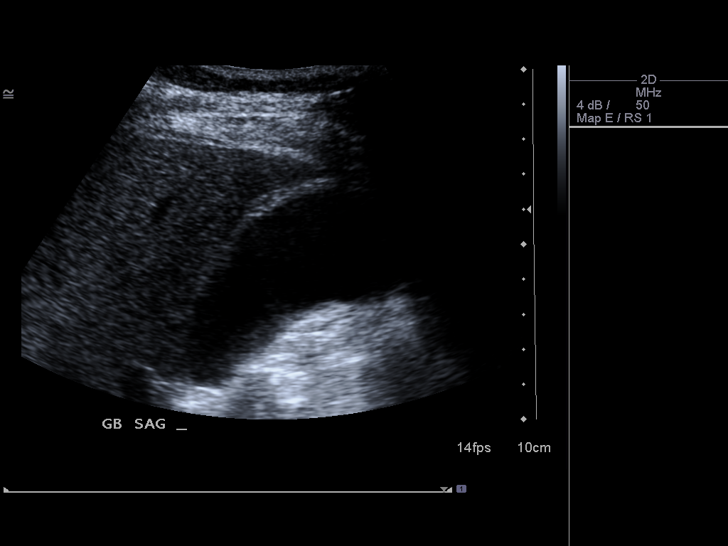
[im 33/71]
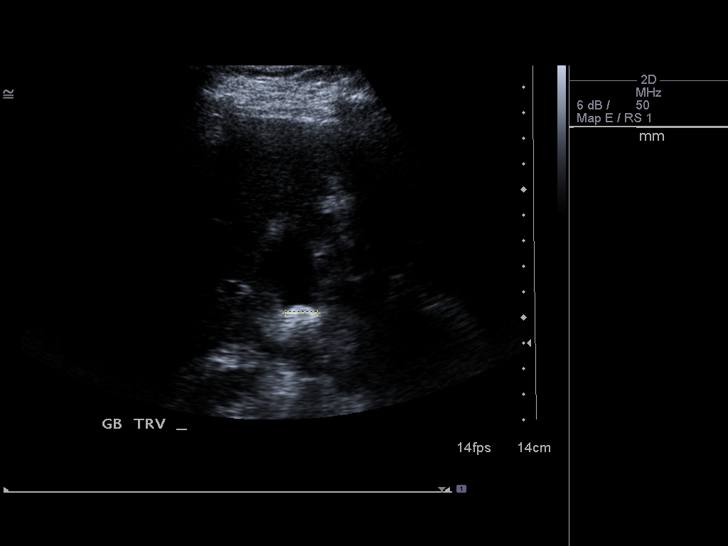
[im 38/71]
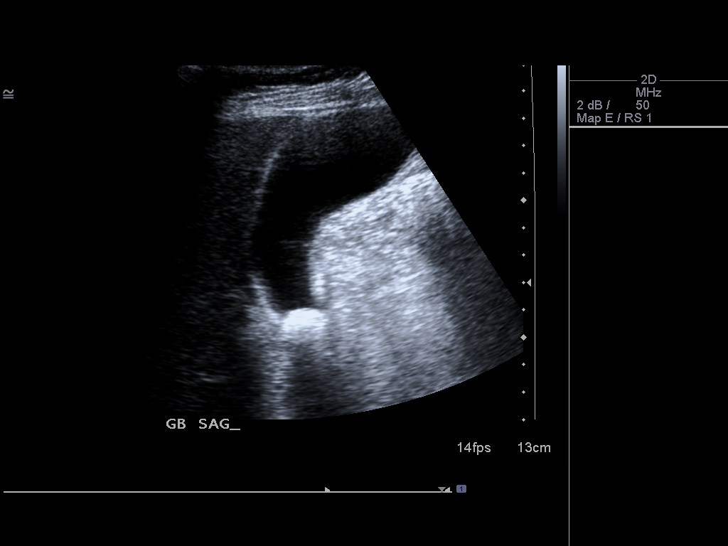
[im 44/71]
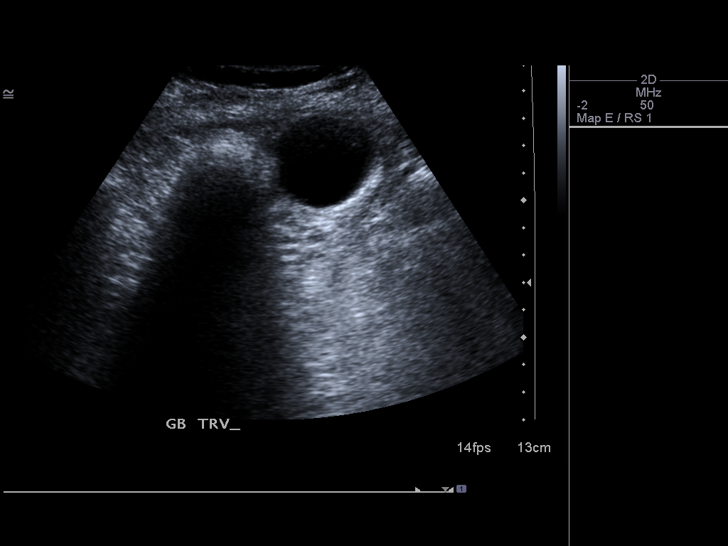
[im 47/71]
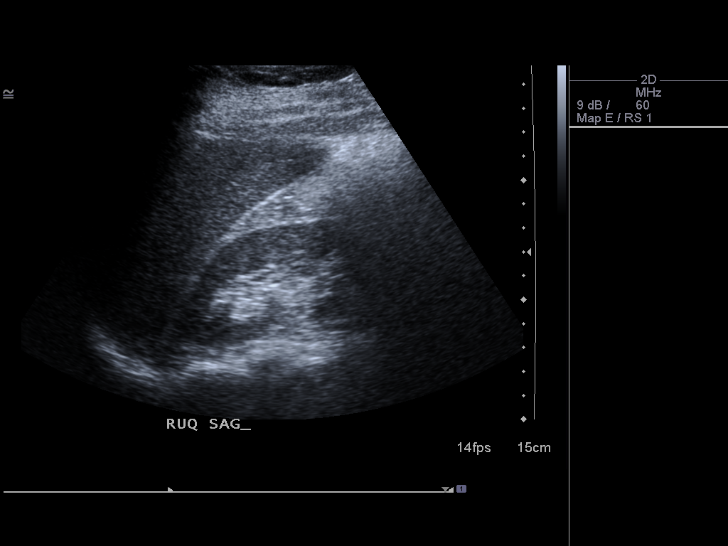
[im 53/71]
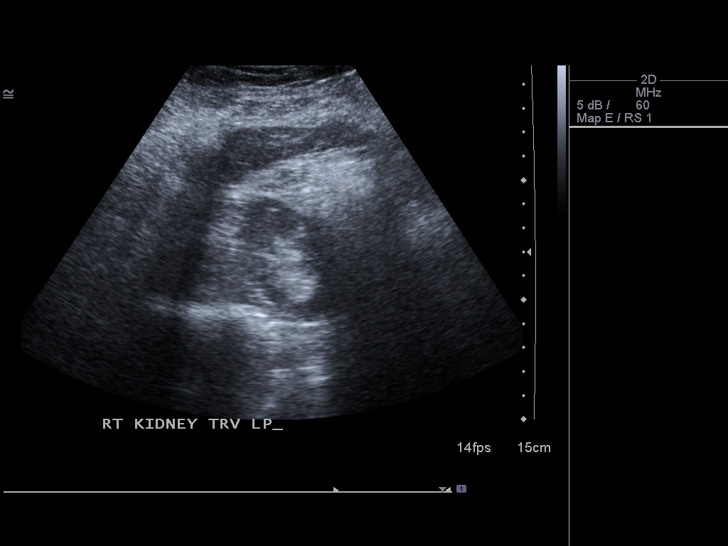
[im 59/71]
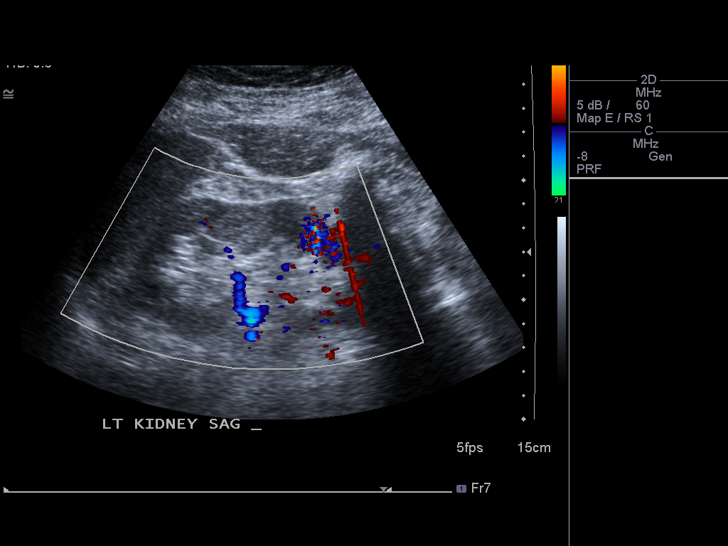
[im 65/71]
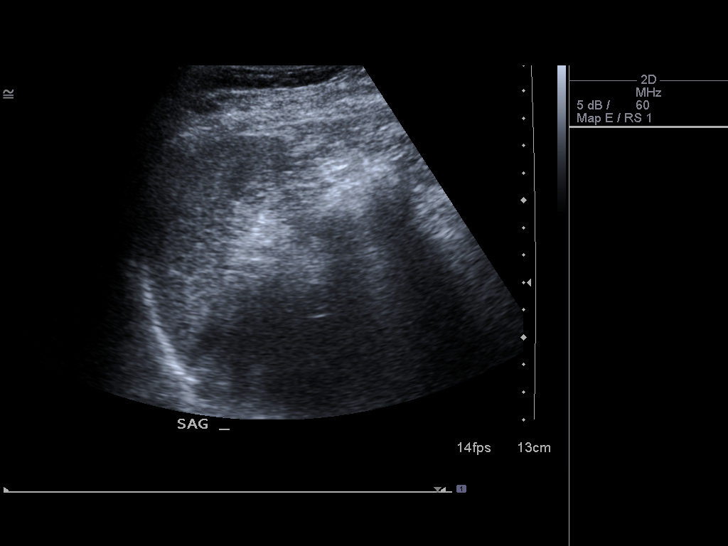
[im 71/71]
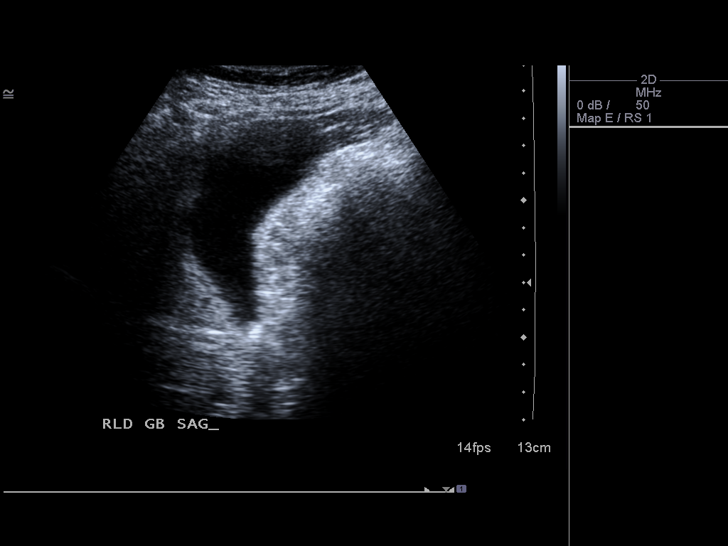

[14 of 25 positions shown; findings below may reference images not displayed]

FINDINGS: Gallbladder:

1.6 cm echogenic stone at the base of the gallbladder with posterior
acoustic shadowing. The stone is not mobile on this exam. The
patient does not have a sonographic [name]'s sign. Gallbladder wall
is mildly thickened measuring up to 5 mm. Mild distention of the
gallbladder.

Common bile duct:

Diameter: 6 mm.

Liver:

No focal lesion identified. Within normal limits in parenchymal
echogenicity.

IVC:

Limited evaluation.

Pancreas:

Limited evaluation due to bowel gas.

Spleen:

Size and appearance within normal limits.

Right Kidney:

Length: 11.1 cm. Echogenicity within normal limits. No mass or
hydronephrosis visualized.

Left Kidney:

Length: 11.0 cm. Echogenicity within normal limits. No mass or
hydronephrosis visualized.

Abdominal aorta:

No aneurysm visualized.

Other findings:

None.
IMPRESSION: 1.6 cm gallstone. Gallbladder wall is mildly thickened, but the
patient does not have a sonographic [name]'s sign. Findings are
equivocal for acute cholecystitis. Recommend clinical correlation.

## 2016-08-17 IMAGING — RF DG CHOLANGIOGRAM OPERATIVE
1 series · 5 of 5 positions shown · non-contrast
Comparison: None.

CLINICAL DATA: Cholelithiasis

EXAM:
INTRAOPERATIVE CHOLANGIOGRAM
TECHNIQUE: Cholangiographic images from the C-arm fluoroscopic device were
submitted for interpretation post-operatively. Please see the
procedural report for the amount of contrast and the fluoroscopy
time utilized.

[Series 1: run · 2 acquisitions, 5 frames shown]
[im 1/2]
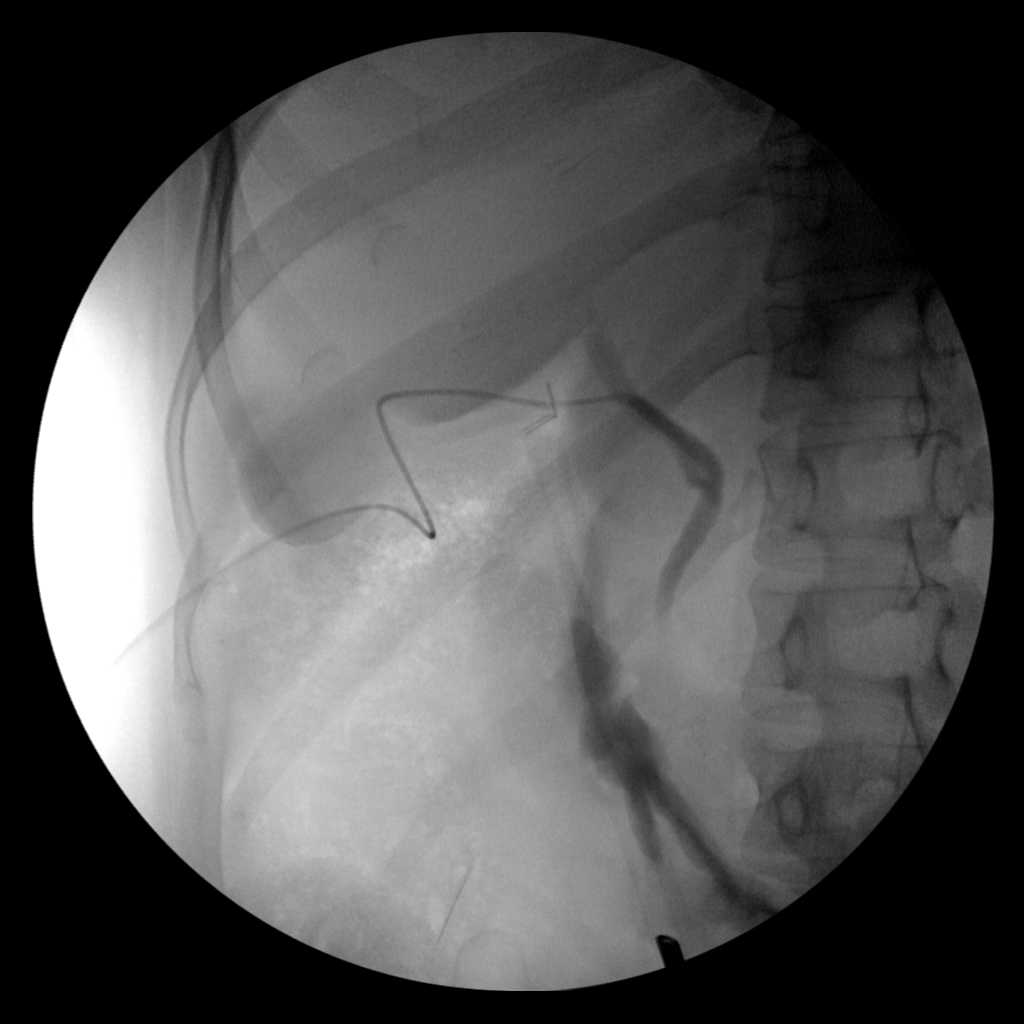
[im 1/2]
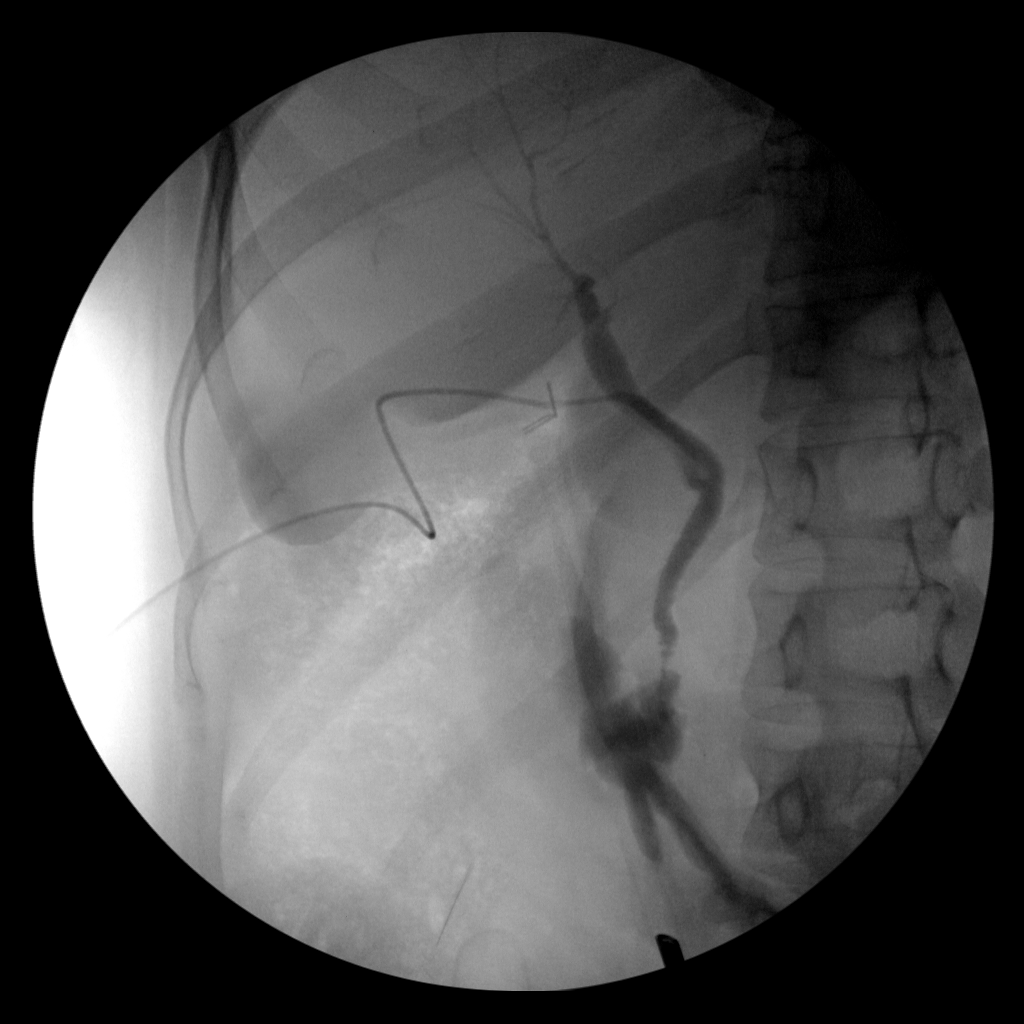
[im 1/2]
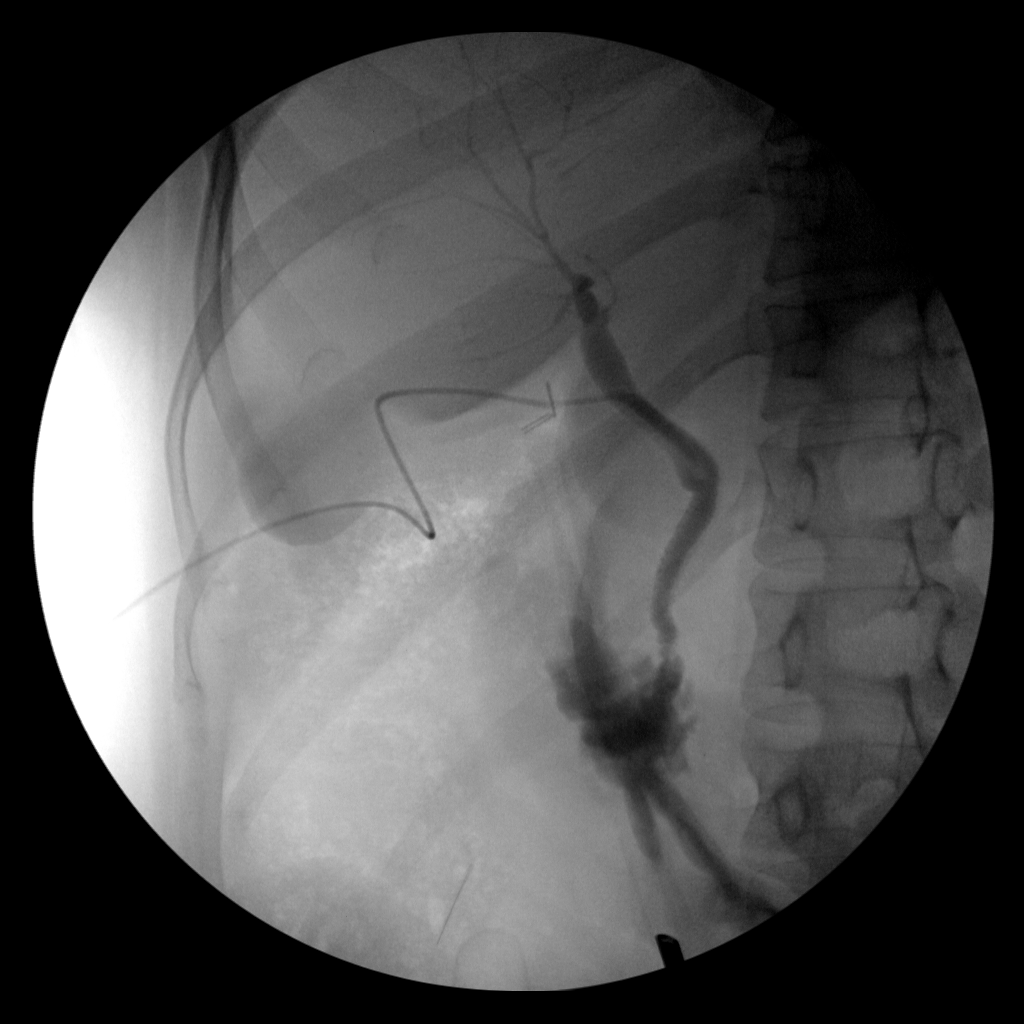
[im 1/2]
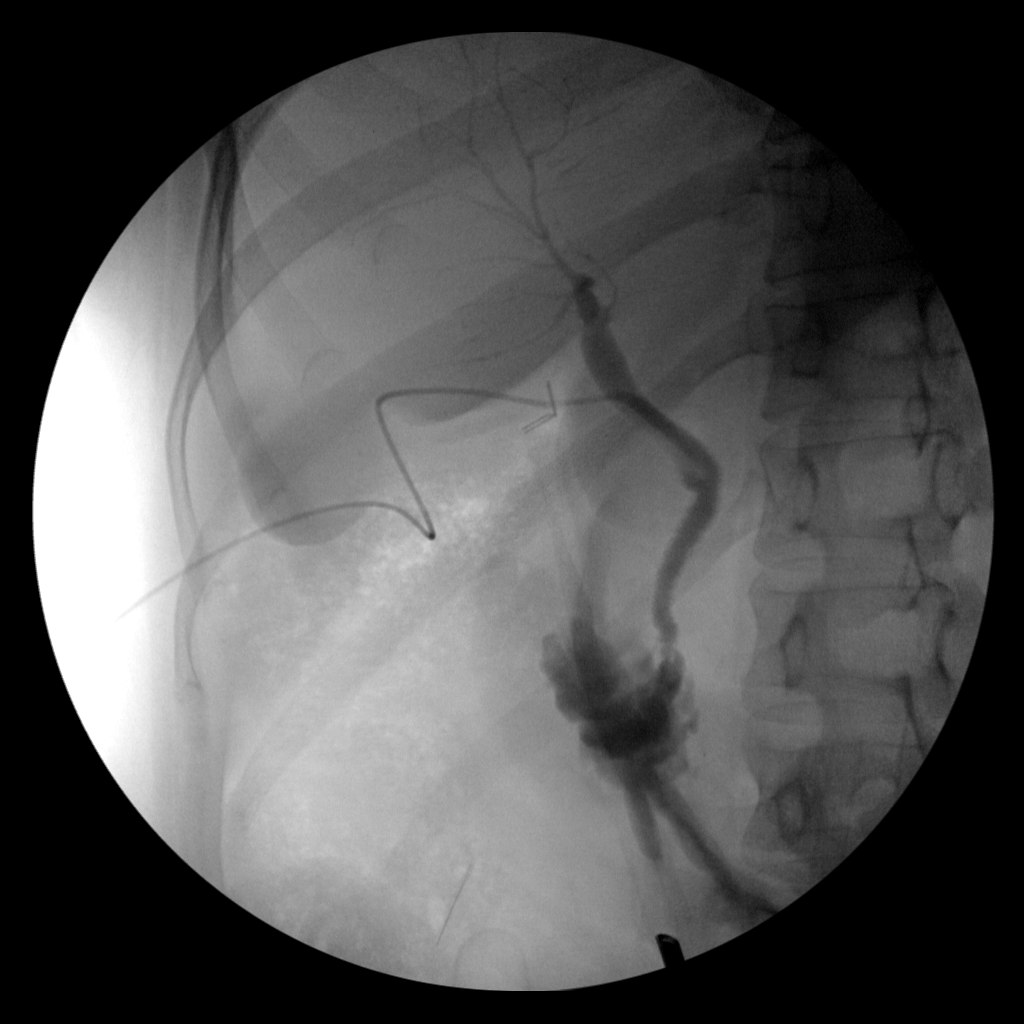
[im 2/2]
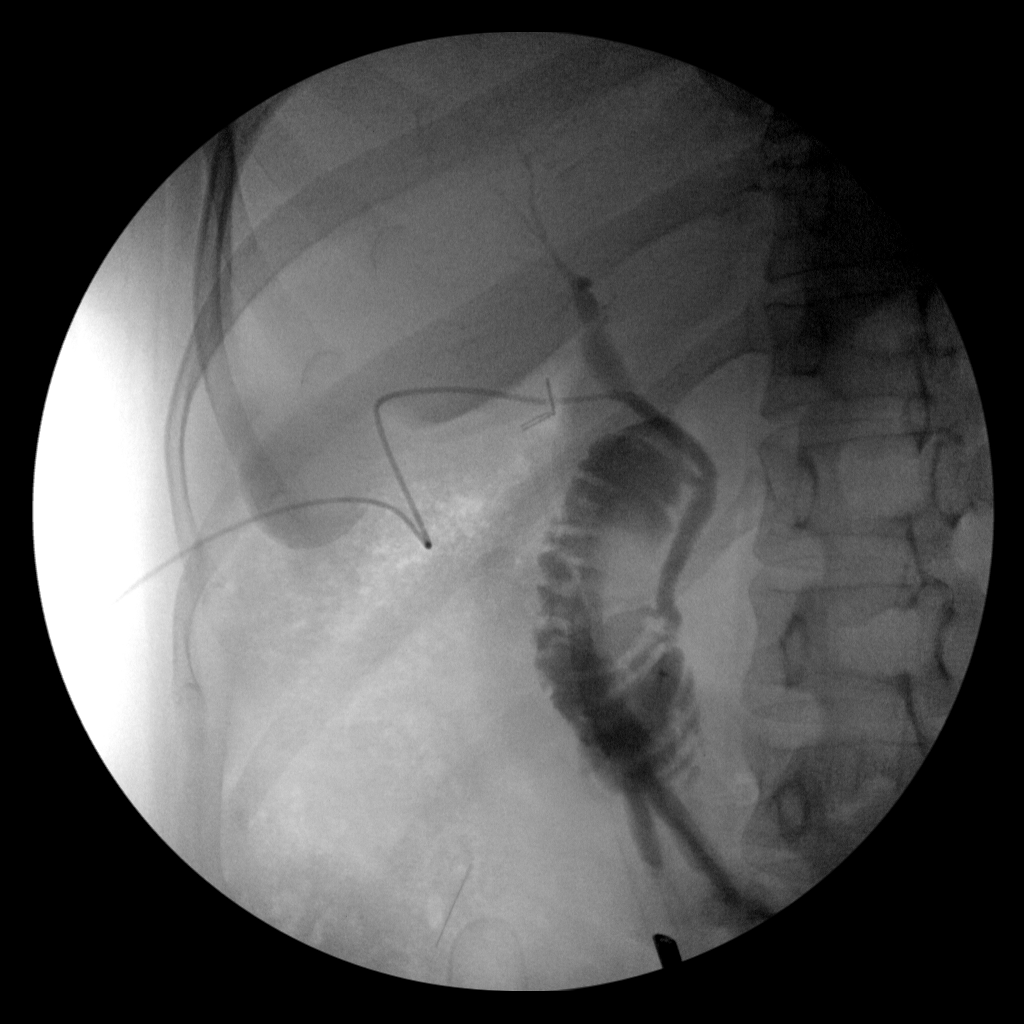

[5 of 5 positions shown; findings below may reference images not displayed]

FINDINGS: Contrast fills the biliary tree and duodenum compatible with
patency. No filling defects in the common bile duct.
IMPRESSION: Patent biliary tree without evidence of common bile duct stone.

## 2020-01-02 ENCOUNTER — Emergency Department (HOSPITAL_BASED_OUTPATIENT_CLINIC_OR_DEPARTMENT_OTHER)
Admission: EM | Admit: 2020-01-02 | Discharge: 2020-01-03 | Disposition: A | Discharge status: Home / Self Care | Payer: 59 | Attending: Emergency Medicine | Admitting: Emergency Medicine

## 2020-01-02 ENCOUNTER — Encounter (HOSPITAL_BASED_OUTPATIENT_CLINIC_OR_DEPARTMENT_OTHER): Payer: Self-pay | Admitting: *Deleted

## 2020-01-02 ENCOUNTER — Other Ambulatory Visit: Payer: Self-pay

## 2020-01-02 DIAGNOSIS — R197 Diarrhea, unspecified: Secondary | ICD-10-CM | POA: Insufficient documentation

## 2020-01-02 DIAGNOSIS — R112 Nausea with vomiting, unspecified: Secondary | ICD-10-CM | POA: Diagnosis not present

## 2020-01-02 DIAGNOSIS — R109 Unspecified abdominal pain: Secondary | ICD-10-CM | POA: Insufficient documentation

## 2020-01-02 DIAGNOSIS — I10 Essential (primary) hypertension: Secondary | ICD-10-CM | POA: Insufficient documentation

## 2020-01-02 LAB — CBC WITH DIFFERENTIAL/PLATELET
Abs Immature Granulocytes: 0.04 10*3/uL (ref 0.00–0.07)
Basophils Absolute: 0 10*3/uL (ref 0.0–0.1)
Basophils Relative: 0 %
Eosinophils Absolute: 0.2 10*3/uL (ref 0.0–0.5)
Eosinophils Relative: 3 %
HCT: 44 % (ref 36.0–46.0)
Hemoglobin: 14.9 g/dL (ref 12.0–15.0)
Immature Granulocytes: 1 %
Lymphocytes Relative: 40 %
Lymphs Abs: 3.5 10*3/uL (ref 0.7–4.0)
MCH: 29.7 pg (ref 26.0–34.0)
MCHC: 33.9 g/dL (ref 30.0–36.0)
MCV: 87.6 fL (ref 80.0–100.0)
Monocytes Absolute: 0.4 10*3/uL (ref 0.1–1.0)
Monocytes Relative: 5 %
Neutro Abs: 4.6 10*3/uL (ref 1.7–7.7)
Neutrophils Relative %: 51 %
Platelets: 289 10*3/uL (ref 150–400)
RBC: 5.02 MIL/uL (ref 3.87–5.11)
RDW: 12.3 % (ref 11.5–15.5)
WBC: 8.8 10*3/uL (ref 4.0–10.5)
nRBC: 0 % (ref 0.0–0.2)

## 2020-01-02 LAB — COMPREHENSIVE METABOLIC PANEL
ALT: 21 U/L (ref 0–44)
AST: 30 U/L (ref 15–41)
Albumin: 4.4 g/dL (ref 3.5–5.0)
Alkaline Phosphatase: 92 U/L (ref 38–126)
Anion gap: 15 (ref 5–15)
BUN: 16 mg/dL (ref 6–20)
CO2: 22 mmol/L (ref 22–32)
Calcium: 9.6 mg/dL (ref 8.9–10.3)
Chloride: 100 mmol/L (ref 98–111)
Creatinine, Ser: 0.87 mg/dL (ref 0.44–1.00)
GFR calc Af Amer: >60 mL/min (ref >60)
GFR calc non Af Amer: >60 mL/min (ref >60)
Glucose, Bld: 161 mg/dL — ABNORMAL HIGH (ref 70–99)
Potassium: 3.3 mmol/L — ABNORMAL LOW (ref 3.5–5.1)
Sodium: 137 mmol/L (ref 135–145)
Total Bilirubin: 0.6 mg/dL (ref 0.3–1.2)
Total Protein: 7.9 g/dL (ref 6.5–8.1)

## 2020-01-02 LAB — LIPASE, BLOOD: Lipase: 62 U/L — ABNORMAL HIGH (ref 11–51)

## 2020-01-02 NOTE — ED Notes (Signed)
Pt has a urine cup and is aware she needs a urine sample 

## 2020-01-02 NOTE — ED Triage Notes (Signed)
C/o abd pain with n/v/d x 2 days

## 2020-01-03 LAB — URINALYSIS, ROUTINE W REFLEX MICROSCOPIC
Glucose, UA: NEGATIVE mg/dL
Hgb urine dipstick: NEGATIVE
Ketones, ur: 15 mg/dL — AB
Nitrite: NEGATIVE
Protein, ur: NEGATIVE mg/dL
Specific Gravity, Urine: >1.03 — ABNORMAL HIGH (ref 1.005–1.030)
pH: 5.5 (ref 5.0–8.0)

## 2020-01-03 LAB — URINALYSIS, MICROSCOPIC (REFLEX): RBC / HPF: NONE SEEN RBC/hpf (ref 0–5)

## 2020-01-03 MED ORDER — ONDANSETRON 4 MG PO TBDP: 4.0000 mg | ORAL_TABLET | Freq: Three times a day (TID) | ORAL | 0 refills | Status: DC | PRN

## 2020-01-03 NOTE — Discharge Instructions (Signed)
You were evaluated in the Emergency Department and after careful evaluation, we did not find any emergent condition requiring admission or further testing in the hospital.  Your exam/testing today is overall reassuring.  Your symptoms seem to be due to food poisoning.  Please use the Zofran antinausea medication as needed and drink plenty of fluids at home.  Use Tylenol or Motrin at home for discomfort.  We suspect you will be feeling better over the next few days.  Please return to the Emergency Department if you experience any worsening of your condition.  We encourage you to follow up with a primary care provider.  Thank you for allowing [place] to be a part of your care.

## 2020-01-03 NOTE — ED Provider Notes (Signed)
[facility] Emergency Department Provider Note MRN:  [id number]  Arrival date & time: 01/03/20     Chief Complaint   Abdominal Pain   History of Present Illness   [name] is a 59 y.o. year-old female with a history of GERD, hypertension presenting to the ED with chief complaint of abdominal.  Patient had a bowl of soup at [employer] 2 or 3 days ago and it "did not taste right".  Less than 6 hours later began having nausea, vomiting, diarrhea, diffuse abdominal bloating and discomfort.  No blood in the vomit or stool, no recent travel, no recent antibiotics.  Denies chest pain or shortness of breath.  Symptoms constant, no exacerbating or alleviating factors.  Review of Systems  A complete 10 system review of systems was obtained and all systems are negative except as noted in the HPI and PMH.   Patient's Health History    Past Medical History:  Diagnosis Date  . Arthritis    upper back & hips - OA  . Depression   . GERD (gastroesophageal reflux disease)   . Headache(784.0)    has gone off caffeine & done with out headache for a long time ago   . Hypertension   . Lactose intolerance   . Menopause   . Renal calculus or stone    passes on her own  . Sleep disorder    had sleep study, was told that she has restless leg syndrome, states that she has just learned to live with it, no Rx thus far.     Past Surgical History:  Procedure Laterality Date  . ABDOMINAL HYSTERECTOMY  4/09   total  . ABDOMINAL SURGERY     "tummy tuck "  . CESAREAN SECTION  7/83  . EYE SURGERY Bilateral    lasik  . LAPAROSCOPIC CHOLECYSTECTOMY SINGLE PORT N/A 03/27/2014   Procedure: LAPAROSCOPIC CHOLECYSTECTOMY SINGLE PORT WITH INTRAOPERATIVE CHOLANGIOGRAM ;  Surgeon: Karie Soda, MD;  Location: [facility] OR;  Service: General;  Laterality: N/A;  . VAGINAL DELIVERY     prior to C/Section    Family History  Problem Relation Age of Onset  . Cancer Father        brown  lung    Social History   Socioeconomic History  . Marital status: Married    Spouse name: Not on file  . Number of children: Not on file  . Years of education: Not on file  . Highest education level: Not on file  Occupational History  . Not on file  Tobacco Use  . Smoking status: Never Smoker  . Smokeless tobacco: Never Used  Substance and Sexual Activity  . Alcohol use: No  . Drug use: No  . Sexual activity: Yes    Birth control/protection: Surgical  Other Topics Concern  . Not on file  Social History Narrative  . Not on file   Social Determinants of Health   Financial Resource Strain:   . Difficulty of Paying Living Expenses:   Food Insecurity:   . Worried About Programme researcher, broadcasting/film/video in the Last Year:   . Barista in the Last Year:   Transportation Needs:   . Freight forwarder (Medical):   Marland Kitchen Lack of Transportation (Non-Medical):   Physical Activity:   . Days of Exercise per Week:   . Minutes of Exercise per Session:   Stress:   . Feeling of Stress :   Social Connections:   .  Frequency of Communication with Friends and Family:   . Frequency of Social Gatherings with Friends and Family:   . Attends Religious Services:   . Active Member of Clubs or Organizations:   . Attends Banker Meetings:   Marland Kitchen Marital Status:   Intimate Partner Violence:   . Fear of Current or Ex-Partner:   . Emotionally Abused:   Marland Kitchen Physically Abused:   . Sexually Abused:      Physical Exam   Vitals:   01/02/20 2108 01/02/20 2351  BP: 113/84 116/69  Pulse: (!) 103 97  Resp: 16 14  Temp: 97.6 F (36.4 C) (!) 97.5 F (36.4 C)  SpO2: 100% 97%    CONSTITUTIONAL: Well-appearing, NAD NEURO:  Alert and oriented x 3, no focal deficits EYES:  eyes equal and reactive ENT/NECK:  no LAD, no JVD CARDIO: Regular rate, well-perfused, normal S1 and S2 PULM:  CTAB no wheezing or rhonchi GI/GU:  normal bowel sounds, non-distended, non-tender MSK/SPINE:  No gross  deformities, no edema SKIN:  no rash, atraumatic PSYCH:  Appropriate speech and behavior  *Additional and/or pertinent findings included in MDM below  Diagnostic and Interventional Summary    EKG Interpretation  Date/Time:    Ventricular Rate:    PR Interval:    QRS Duration:   QT Interval:    QTC Calculation:   R Axis:     Text Interpretation:        Labs Reviewed  URINALYSIS, ROUTINE W REFLEX MICROSCOPIC - Abnormal; Notable for the following components:      Result Value   APPearance HAZY (*)    Specific Gravity, Urine >1.030 (*)    Bilirubin Urine SMALL (*)    Ketones, ur 15 (*)    Leukocytes,Ua TRACE (*)    All other components within normal limits  COMPREHENSIVE METABOLIC PANEL - Abnormal; Notable for the following components:   Potassium 3.3 (*)    Glucose, Bld 161 (*)    All other components within normal limits  LIPASE, BLOOD - Abnormal; Notable for the following components:   Lipase 62 (*)    All other components within normal limits  URINALYSIS, MICROSCOPIC (REFLEX) - Abnormal; Notable for the following components:   Bacteria, UA RARE (*)    All other components within normal limits  CBC WITH DIFFERENTIAL/PLATELET    No orders to display    Medications - No data to display   Procedures  /  Critical Care Procedures  ED Course and Medical Decision Making  I have reviewed the triage vital signs, the nursing notes, and pertinent available records from the EMR.  Listed above are laboratory and imaging tests that I personally ordered, reviewed, and interpreted and then considered in my medical decision making (see below for details).      Suspect foodborne illness, likely bacterial preformed toxin.  Abdomen is soft and nontender, no focal tenderness, no rebound, no guarding, no rigidity, very reassuring.  I see no indication for imaging this evening.  Labs are reassuring, appropriate for discharge with Zofran and return precautions.    Elmer Sow. Pilar Plate,  MD [facility] Emergency Medicine [facility] [email or web address]  Final Clinical Impressions(s) / ED Diagnoses     ICD-10-CM   1. Abdominal pain, unspecified abdominal location  R10.9   2. Nausea vomiting and diarrhea  R11.2    R19.7     ED Discharge Orders         Ordered    ondansetron (ZOFRAN ODT)  4 MG disintegrating tablet  Every 8 hours PRN     Discontinue  Reprint     01/03/20 0103           Discharge Instructions Discussed with and Provided to Patient:     Discharge Instructions     You were evaluated in the Emergency Department and after careful evaluation, we did not find any emergent condition requiring admission or further testing in the hospital.  Your exam/testing today is overall reassuring.  Your symptoms seem to be due to food poisoning.  Please use the Zofran antinausea medication as needed and drink plenty of fluids at home.  Use Tylenol or Motrin at home for discomfort.  We suspect you will be feeling better over the next few days.  Please return to the Emergency Department if you experience any worsening of your condition.  We encourage you to follow up with a primary care provider.  Thank you for allowing [place] to be a part of your care.      Sabas Sous, MD 01/03/20 [phone]

## 2020-01-03 NOTE — ED Notes (Signed)
ED Provider at bedside. 

## 2023-03-12 NOTE — Progress Notes (Signed)
[address]       [place] [address]             [phone]                    [name] [facility] Medical Record #[id number] Date of Birth: October 14, 1960  Referring: Shirleen Schirmer, PA-C Primary Care: Barbie Banner, MD Primary Cardiologist: None  Chief Complaint:    Chief Complaint  Patient presents with   Hiatal Hernia    New patient consultation, chest CT 8/2    History of Present Illness:    [name] 62 y.o. female referred for surgical evaluation of moderate to large hiatal hernia.  Her symptoms have recently worsened.  She regularly has dysphagia, and wakes up hoarse, and often need to clear her throat.  She also has had to increase her anti-acid medication.          Past Medical History:  Diagnosis Date   Arthritis    upper back & hips - OA   Depression    GERD (gastroesophageal reflux disease)    Headache(784.0)    has gone off caffeine & done with out headache for a long time ago    Hypertension    Lactose intolerance    Menopause    Renal calculus or stone    passes on her own   Sleep disorder    had sleep study, was told that she has restless leg syndrome, states that she has just learned to live with it, no Rx thus far.     Past Surgical History:  Procedure Laterality Date   ABDOMINAL HYSTERECTOMY  4/09   total   ABDOMINAL SURGERY     "tummy tuck "   CESAREAN SECTION  7/83   EYE SURGERY Bilateral    lasik   LAPAROSCOPIC CHOLECYSTECTOMY SINGLE PORT N/A 03/27/2014   Procedure: LAPAROSCOPIC CHOLECYSTECTOMY SINGLE PORT WITH INTRAOPERATIVE CHOLANGIOGRAM ;  Surgeon: Karie Soda, MD;  Location: [facility] OR;  Service: General;  Laterality: N/A;   VAGINAL DELIVERY     prior to C/Section    Family History  Problem Relation Age of Onset   Cancer Father        brown lung     Social History   Tobacco Use  Smoking Status Never  Smokeless Tobacco Never    Social History   Substance and Sexual Activity  Alcohol  Use No     Allergies  Allergen Reactions   Penicillins Swelling    tongue   Compazine    Prochlorperazine Edisylate Diarrhea    Current Outpatient Medications  Medication Sig Dispense Refill   acetaminophen (TYLENOL) 500 MG tablet Take 1,000 mg by mouth every 6 (six) hours as needed.     benzonatate (TESSALON) 100 MG capsule Take 100 mg by mouth 3 (three) times daily as needed for cough.     lisinopril-hydrochlorothiazide (PRINZIDE,ZESTORETIC) 10-12.5 MG per tablet Take 1 tablet by mouth daily with breakfast.      Multiple Vitamin (MULTIVITAMIN) tablet Take 1 tablet by mouth daily.       omeprazole (PRILOSEC) 40 MG capsule Take 40 mg by mouth daily.     PRISTIQ 100 MG 24 hr tablet Take 100 mg by mouth daily with breakfast.      rOPINIRole (REQUIP) 1 MG tablet Take 1 mg by mouth in the morning and at bedtime.     rosuvastatin (CRESTOR) 10 MG tablet Take 10  mg by mouth daily.     sucralfate (CARAFATE) 1 g tablet Take 1 g by mouth 4 (four) times daily.     No current facility-administered medications for this visit.    Review of Systems  Constitutional: Negative.   Cardiovascular:  Negative for chest pain.  Gastrointestinal:  Positive for abdominal pain and heartburn.  Neurological: Negative.      PHYSICAL EXAMINATION: BP (!) 157/77 (BP Location: Left Arm, Patient Position: Sitting, Cuff Size: Normal)   Pulse 84   Resp 20   Ht 5\' 2"  (1.575 m)   Wt 157 lb 9.6 oz (71.5 kg)   SpO2 96% Comment: RA  BMI 28.83 kg/m  Physical Exam Constitutional:      Appearance: She is not ill-appearing.  Cardiovascular:     Rate and Rhythm: Normal rate.  Pulmonary:     Effort: Pulmonary effort is normal. No respiratory distress.  Musculoskeletal:        General: Normal range of motion.  Skin:    General: Skin is warm and dry.  Neurological:     General: No focal deficit present.     Mental Status: She is alert and oriented to person, place, and time.     Diagnostic Studies &  Laboratory data:    CT Scan: small sliding hiatal hernia      I have independently reviewed the above radiology studies  and reviewed the findings with the patient.   Recent Lab Findings: Lab Results  Component Value Date   WBC 8.8 01/02/2020   HGB 14.9 01/02/2020   HCT 44.0 01/02/2020   PLT 289 01/02/2020   GLUCOSE 161 (H) 01/02/2020   CHOL 249 (H) 04/10/2011   TRIG 309 (H) 04/10/2011   HDL 50 04/10/2011   LDLCALC 137 (H) 04/10/2011   ALT 21 01/02/2020   AST 30 01/02/2020   NA 137 01/02/2020   K 3.3 (L) 01/02/2020   CL 100 01/02/2020   CREATININE 0.87 01/02/2020   BUN 16 01/02/2020   CO2 22 01/02/2020   TSH 1.681 04/10/2011   INR 1.0 10/25/2007       Assessment / Plan:   61yo female with a hiatal hernia.  I personally reviewed her imaging from [facility], and she does have a small hiatal hernia.  Her EGD report from 7years ago does describe a moderate sized hernia, and she states that she was also told that she had a stricture.  What is most concerning to me is the distal esophageal thickening that is evident on the scan.  I would like for her to see Dr. Lavon Paganini for further evaluation.  If all the findings are benign, given that she may potentially have a stricture, I think that this, in addition to her symptoms would be an indication for hiatal hernia repair with fundoplication.  I will make the appointment, and follow-up with her after her swallow study.    I  spent 40 minutes with the patient face to face counseling and coordination of care.    Corliss Skains 03/15/2023 9:50 AM

## 2023-03-13 ENCOUNTER — Institutional Professional Consult (permissible substitution): Payer: 59 | Admitting: Thoracic Surgery (Cardiothoracic Vascular Surgery)

## 2023-03-13 ENCOUNTER — Other Ambulatory Visit: Payer: Self-pay | Admitting: Thoracic Surgery (Cardiothoracic Vascular Surgery)

## 2023-03-13 ENCOUNTER — Encounter: Payer: Self-pay | Admitting: Thoracic Surgery (Cardiothoracic Vascular Surgery)

## 2023-03-13 VITALS — BP 157/77 | HR 84 | Resp 20 | Ht 62.0 in | Wt 157.6 lb

## 2023-03-13 DIAGNOSIS — K449 Diaphragmatic hernia without obstruction or gangrene: Secondary | ICD-10-CM

## 2023-03-17 ENCOUNTER — Ambulatory Visit
Admission: RE | Admit: 2023-03-17 | Discharge: 2023-03-17 | Disposition: A | Discharge status: Home / Self Care | Payer: 59 | Source: Ambulatory Visit | Attending: Thoracic Surgery (Cardiothoracic Vascular Surgery) | Admitting: Thoracic Surgery (Cardiothoracic Vascular Surgery)

## 2023-03-17 DIAGNOSIS — K449 Diaphragmatic hernia without obstruction or gangrene: Secondary | ICD-10-CM

## 2023-03-19 ENCOUNTER — Ambulatory Visit (INDEPENDENT_AMBULATORY_CARE_PROVIDER_SITE_OTHER): Payer: 59 | Admitting: Thoracic Surgery (Cardiothoracic Vascular Surgery)

## 2023-03-19 DIAGNOSIS — K449 Diaphragmatic hernia without obstruction or gangrene: Secondary | ICD-10-CM | POA: Diagnosis not present

## 2023-03-19 NOTE — Progress Notes (Signed)
     [address]       [place] [address]             [phone]       Patient: Home Provider: Office Consent for Telemedicine visit obtained.  Today's visit was completed via a real-time telehealth (see specific modality noted below). The patient/authorized person provided oral consent at the time of the visit to engage in a telemedicine encounter with the present provider at [facility]. The patient/authorized person was informed of the potential benefits, limitations, and risks of telemedicine. The patient/authorized person expressed understanding that the laws that protect confidentiality also apply to telemedicine. The patient/authorized person acknowledged understanding that telemedicine does not provide emergency services and that he or she would need to call 911 or proceed to the nearest hospital for help if such a need arose.   Total time spent in the clinical discussion 10 minutes.  Telehealth Modality: Phone visit (audio only)  I had a telephone visit with [name].  I reviewed her esophagram.  She does not have a stricture, but does have a small type 1 hiatal hernia.  She is scheduled to meet with Dr. Lavon Paganini in a few weeks and her hip surgery on October 17th.  She would still like to have surgery given her reflux symptoms.  I will see her in November.

## 2023-03-20 ENCOUNTER — Telehealth: Payer: Self-pay

## 2023-03-20 NOTE — Telephone Encounter (Signed)
-----   Message from [facility] sent at 03/16/2023 12:06 PM EDT ----- I am sorry our schedules have been a bit crazy, will try to bring her in soon. Beth, on chart review looks like she meets criteria for [facility] please schedule next available appointment with APP. Thank you Tyrone Nine ----- Message ----- From: Corliss Skains, MD Sent: 03/15/2023   9:59 AM EDT To: Napoleon Form, MD  Hey,  I am referring this patient to you for pre-op work-up.  She was previously followed at [facility] for GERD and a hiatal hernia with possible stricture.  She also has some mild distal esophageal thickening.  I have ordered a esophagram.    I told her that is may be a while before you are able to see her, but she is also getting surgery for her hip, so hopefully timing will work out.  Thanks,  H.

## 2023-03-26 ENCOUNTER — Ambulatory Visit: Payer: 59 | Admitting: Physician Assistant

## 2023-03-29 ENCOUNTER — Ambulatory Visit: Payer: Self-pay | Admitting: Student

## 2023-04-02 ENCOUNTER — Ambulatory Visit: Payer: 59 | Admitting: Physician Assistant

## 2023-04-05 NOTE — Progress Notes (Signed)
Anesthesia Review:  PCP: Shawn lazoff clearance 03/18/23 on chart LOV 12/22/22  Cardiologist : Vladimir Creeks Initial consult 02/23/23  Chest x-ray : EKG : 02/23/23- requested from [facility] Records by fax.  Echo : Stress test: Cardiac Cath :  Activity level: can do a flgiht of stairs without difficulty  Sleep Study/ CPAP : none  Fasting Blood Sugar :      / Checks Blood Sugar -- times a day:   Blood Thinner/ Instructions /Last Dose: ASA / Instructions/ Last Dose :    03/17/23- CBC/Diff and CMP

## 2023-04-05 NOTE — Patient Instructions (Signed)
SURGICAL WAITING ROOM VISITATION  Patients having surgery or a procedure may have no more than 2 support people in the waiting area - these visitors may rotate.    Children under the age of 68 must have an adult with them who is not the patient.  Due to an increase in RSV and influenza rates and associated hospitalizations, children ages 50 and under may not visit patients in [facility] hospitals.  If the patient needs to stay at the hospital during part of their recovery, the visitor guidelines for inpatient rooms apply. Pre-op nurse will coordinate an appropriate time for 1 support person to accompany patient in pre-op.  This support person may not rotate.    Please refer to the [facility] website for the visitor guidelines for Inpatients (after your surgery is over and you are in a regular room).       Your procedure is scheduled on:  04/16/2023    Report to [facility] Main Entrance    Report to admitting at   [phone]   Call this number if you have problems the morning of surgery [phone]   Do not eat food :After Midnight.   After Midnight you may have the following liquids until __ 0430____ AM  DAY OF SURGERY  Water Non-Citrus Juices (without pulp, NO RED-Apple, White grape, White cranberry) Black Coffee (NO MILK/CREAM OR CREAMERS, sugar ok)  Clear Tea (NO MILK/CREAM OR CREAMERS, sugar ok) regular and decaf                             Plain Jell-O (NO RED)                                           Fruit ices (not with fruit pulp, NO RED)                                     Popsicles (NO RED)                                                               Sports drinks like Gatorade (NO RED)                    The day of surgery:  Drink ONE (1) Pre-Surgery Clear Ensure or G2 at  0430AM ( have completed by )  the morning of surgery. Drink in one sitting. Do not sip.  This drink was given to you during your hospital  pre-op appointment visit. Nothing else to  drink after completing the  Pre-Surgery Clear Ensure or G2.          If you have questions, please contact your surgeon's office.       Oral Hygiene is also important to reduce your risk of infection.                                    Remember - BRUSH YOUR TEETH THE MORNING OF SURGERY WITH  YOUR REGULAR TOOTHPASTE  DENTURES WILL BE REMOVED PRIOR TO SURGERY PLEASE DO NOT APPLY "Poly grip" OR ADHESIVES!!!   Do NOT smoke after Midnight   Stop all vitamins and herbal supplements 7 days before surgery.   Take these medicines the morning of surgery with A SIP OF WATER:  wellbutrin, omeprazole, pristiq   DO NOT TAKE ANY ORAL DIABETIC MEDICATIONS DAY OF YOUR SURGERY  Bring CPAP mask and tubing day of surgery.                              You may not have any metal on your body including hair pins, jewelry, and body piercing             Do not wear make-up, lotions, powders, perfumes/cologne, or deodorant  Do not wear nail polish including gel and S&S, artificial/acrylic nails, or any other type of covering on natural nails including finger and toenails. If you have artificial nails, gel coating, etc. that needs to be removed by a nail salon please have this removed prior to surgery or surgery may need to be canceled/ delayed if the surgeon/ anesthesia feels like they are unable to be safely monitored.   Do not shave  48 hours prior to surgery.               Men may shave face and neck.   Do not bring valuables to the hospital. [facility] IS NOT             RESPONSIBLE   FOR VALUABLES.   Contacts, glasses, dentures or bridgework may not be worn into surgery.   Bring small overnight bag day of surgery.   DO NOT BRING YOUR HOME MEDICATIONS TO THE HOSPITAL. PHARMACY WILL DISPENSE MEDICATIONS LISTED ON YOUR MEDICATION LIST TO YOU DURING YOUR ADMISSION IN THE HOSPITAL!    Patients discharged on the day of surgery will not be allowed to drive home.  Someone NEEDS to stay with you for the  first 24 hours after anesthesia.   Special Instructions: Bring a copy of your healthcare power of attorney and living will documents the day of surgery if you haven't scanned them before.              Please read over the following fact sheets you were given: IF YOU HAVE QUESTIONS ABOUT YOUR PRE-OP INSTRUCTIONS PLEASE CALL [phone]   If you received a COVID test during your pre-op visit  it is requested that you wear a mask when out in public, stay away from anyone that may not be feeling well and notify your surgeon if you develop symptoms. If you test positive for Covid or have been in contact with anyone that has tested positive in the last 10 days please notify you surgeon.      Pre-operative 5 CHG Bath Instructions   You can play a key role in reducing the risk of infection after surgery. Your skin needs to be as free of germs as possible. You can reduce the number of germs on your skin by washing with CHG (chlorhexidine gluconate) soap before surgery. CHG is an antiseptic soap that kills germs and continues to kill germs even after washing.   DO NOT use if you have an allergy to chlorhexidine/CHG or antibacterial soaps. If your skin becomes reddened or irritated, stop using the CHG and notify one of our RNs at [phone].   Please shower with the CHG  soap starting 4 days before surgery using the following schedule:     Please keep in mind the following:  DO NOT shave, including legs and underarms, starting the day of your first shower.   You may shave your face at any point before/day of surgery.  Place clean sheets on your bed the day you start using CHG soap. Use a clean washcloth (not used since being washed) for each shower. DO NOT sleep with pets once you start using the CHG.   CHG Shower Instructions:  If you choose to wash your hair and private area, wash first with your normal shampoo/soap.  After you use shampoo/soap, rinse your hair and body thoroughly to remove  shampoo/soap residue.  Turn the water OFF and apply about 3 tablespoons (45 ml) of CHG soap to a CLEAN washcloth.  Apply CHG soap ONLY FROM YOUR NECK DOWN TO YOUR TOES (washing for 3-5 minutes)  DO NOT use CHG soap on face, private areas, open wounds, or sores.  Pay special attention to the area where your surgery is being performed.  If you are having back surgery, having someone wash your back for you may be helpful. Wait 2 minutes after CHG soap is applied, then you may rinse off the CHG soap.  Pat dry with a clean towel  Put on clean clothes/pajamas   If you choose to wear lotion, please use ONLY the CHG-compatible lotions on the back of this paper.     Additional instructions for the day of surgery: DO NOT APPLY any lotions, deodorants, cologne, or perfumes.   Put on clean/comfortable clothes.  Brush your teeth.  Ask your nurse before applying any prescription medications to the skin.      CHG Compatible Lotions   Aveeno Moisturizing lotion  Cetaphil Moisturizing Cream  Cetaphil Moisturizing Lotion  Clairol Herbal Essence Moisturizing Lotion, Dry Skin  Clairol Herbal Essence Moisturizing Lotion, Extra Dry Skin  Clairol Herbal Essence Moisturizing Lotion, Normal Skin  Curel Age Defying Therapeutic Moisturizing Lotion with Alpha Hydroxy  Curel Extreme Care Body Lotion  Curel Soothing Hands Moisturizing Hand Lotion  Curel Therapeutic Moisturizing Cream, Fragrance-Free  Curel Therapeutic Moisturizing Lotion, Fragrance-Free  Curel Therapeutic Moisturizing Lotion, Original Formula  Eucerin Daily Replenishing Lotion  Eucerin Dry Skin Therapy Plus Alpha Hydroxy Crme  Eucerin Dry Skin Therapy Plus Alpha Hydroxy Lotion  Eucerin Original Crme  Eucerin Original Lotion  Eucerin Plus Crme Eucerin Plus Lotion  Eucerin TriLipid Replenishing Lotion  Keri Anti-Bacterial Hand Lotion  Keri Deep Conditioning Original Lotion Dry Skin Formula Softly Scented  Keri Deep Conditioning  Original Lotion, Fragrance Free Sensitive Skin Formula  Keri Lotion Fast Absorbing Fragrance Free Sensitive Skin Formula  Keri Lotion Fast Absorbing Softly Scented Dry Skin Formula  Keri Original Lotion  Keri Skin Renewal Lotion Keri Silky Smooth Lotion  Keri Silky Smooth Sensitive Skin Lotion  Nivea Body Creamy Conditioning Oil  Nivea Body Extra Enriched Teacher, adult education Moisturizing Lotion Nivea Crme  Nivea Skin Firming Lotion  NutraDerm 30 Skin Lotion  NutraDerm Skin Lotion  NutraDerm Therapeutic Skin Cream  NutraDerm Therapeutic Skin Lotion  ProShield Protective Hand Cream  Provon moisturizing lotion

## 2023-04-07 ENCOUNTER — Encounter (HOSPITAL_COMMUNITY): Payer: Self-pay

## 2023-04-07 ENCOUNTER — Other Ambulatory Visit: Payer: Self-pay

## 2023-04-07 ENCOUNTER — Encounter (HOSPITAL_COMMUNITY)
Admission: RE | Admit: 2023-04-07 | Discharge: 2023-04-07 | Disposition: A | Discharge status: Home / Self Care | Payer: 59 | Source: Ambulatory Visit | Attending: Orthopedic Surgery | Admitting: Orthopedic Surgery

## 2023-04-07 VITALS — BP 135/94 | HR 87 | Temp 98.3°F | Resp 16 | Ht 62.0 in | Wt 157.6 lb

## 2023-04-07 DIAGNOSIS — Z01818 Encounter for other preprocedural examination: Secondary | ICD-10-CM | POA: Diagnosis present

## 2023-04-07 HISTORY — DX: Other specified postprocedural states: Z98.890

## 2023-04-07 HISTORY — DX: Nausea with vomiting, unspecified: R11.2

## 2023-04-07 HISTORY — DX: Personal history of urinary calculi: Z87.442

## 2023-04-08 ENCOUNTER — Encounter (HOSPITAL_COMMUNITY): Payer: Self-pay

## 2023-04-08 ENCOUNTER — Ambulatory Visit: Payer: Self-pay | Admitting: Student

## 2023-04-08 LAB — SURGICAL PCR SCREEN
MRSA, PCR: POSITIVE — AB
Staphylococcus aureus: POSITIVE — AB

## 2023-04-08 NOTE — H&P (Signed)
TOTAL HIP ADMISSION H&P  Patient is admitted for right total hip arthroplasty.  Subjective:  Chief Complaint: right hip pain  HPI: [name], 62 y.o. female, has a history of pain and functional disability in the right hip(s) due to arthritis and patient has failed non-surgical conservative treatments for greater than 12 weeks to include NSAID's and/or analgesics, flexibility and strengthening excercises, use of assistive devices, and activity modification.  Onset of symptoms was gradual starting 9 years ago with rapidlly worsening course since that time.The patient noted no past surgery on the right hip(s).  Patient currently rates pain in the right hip at 10 out of 10 with activity. Patient has night pain, worsening of pain with activity and weight bearing, trendelenberg gait, pain that interfers with activities of daily living, and pain with passive range of motion. Patient has evidence of subchondral cysts, subchondral sclerosis, periarticular osteophytes, and joint space narrowing by imaging studies. This condition presents safety issues increasing the risk of falls.  There is no current active infection.  Patient Active Problem List   Diagnosis Date Noted   Hiatal hernia 03/13/2023   Chronic cholecystitis with calculus 02/20/2014   Constipation, chronic 02/20/2014   Menopause    Renal calculus or stone    Lactose intolerance    Nonspecific (abnormal) findings on radiological and other examination of body structure 03/16/2009   ABNORMAL CHEST XRAY 03/16/2009   DEPRESSION 03/15/2009   HYPERTENSION 03/15/2009   Past Medical History:  Diagnosis Date   Arthritis    upper back & hips - OA   Depression    GERD (gastroesophageal reflux disease)    History of kidney stones    Hypertension    Lactose intolerance    Menopause    PONV (postoperative nausea and vomiting)    Sleep disorder    had sleep study, was told that she has restless leg syndrome, states that she has just  learned to live with it, no Rx thus far.     Past Surgical History:  Procedure Laterality Date   ABDOMINAL HYSTERECTOMY  09/29/2007   total   ABDOMINAL SURGERY     "tummy tuck "   CESAREAN SECTION  12/28/1981   EYE SURGERY Bilateral    lasik   glaucoma eye surgery      LAPAROSCOPIC CHOLECYSTECTOMY SINGLE PORT N/A 03/27/2014   Procedure: LAPAROSCOPIC CHOLECYSTECTOMY SINGLE PORT WITH INTRAOPERATIVE CHOLANGIOGRAM ;  Surgeon: Karie Soda, MD;  Location: [facility] OR;  Service: General;  Laterality: N/A;   VAGINAL DELIVERY     prior to C/Section    Current Outpatient Medications  Medication Sig Dispense Refill Last Dose   acetaminophen (TYLENOL) 500 MG tablet Take 1,000 mg by mouth every 6 (six) hours as needed (pain.).      benzonatate (TESSALON) 100 MG capsule Take 100 mg by mouth 3 (three) times daily as needed for cough.      Biotin 1000 MCG tablet Take 1,000 mcg by mouth daily.      buPROPion (WELLBUTRIN XL) 300 MG 24 hr tablet Take 300 mg by mouth in the morning.      lisinopril (ZESTRIL) 10 MG tablet Take 10 mg by mouth in the morning.      Melatonin 10 MG TABS Take 10 mg by mouth at bedtime.      naproxen sodium (ALEVE) 220 MG tablet Take 220-440 mg by mouth 2 (two) times daily as needed (pain.).      omeprazole (PRILOSEC) 40 MG capsule Take 40 mg  by mouth in the morning.      PRISTIQ 100 MG 24 hr tablet Take 100 mg by mouth in the morning.      rOPINIRole (REQUIP) 1 MG tablet Take 1 mg by mouth in the morning and at bedtime.      rosuvastatin (CRESTOR) 10 MG tablet Take 10 mg by mouth in the morning.      sucralfate (CARAFATE) 1 g tablet Take 1 g by mouth 4 (four) times daily.      No current facility-administered medications for this visit.   Allergies  Allergen Reactions   Penicillins Swelling    tongue   Compazine    Prochlorperazine Edisylate Diarrhea   Venlafaxine Itching    Pristiq is tolerated.  Effexor was used on recommendation by Teacher, English as a foreign language    Social History    Tobacco Use   Smoking status: Never   Smokeless tobacco: Never  Substance Use Topics   Alcohol use: No    Family History  Problem Relation Age of Onset   Cancer Father        brown lung     Review of Systems  Musculoskeletal:  Positive for arthralgias and gait problem.  All other systems reviewed and are negative.   Objective:  Physical Exam Constitutional:      Appearance: Normal appearance.  HENT:     Head: Normocephalic and atraumatic.     Nose: Nose normal.     Mouth/Throat:     Mouth: Mucous membranes are moist.     Pharynx: Oropharynx is clear.  Eyes:     Conjunctiva/sclera: Conjunctivae normal.  Cardiovascular:     Rate and Rhythm: Normal rate and regular rhythm.     Pulses: Normal pulses.     Heart sounds: Normal heart sounds.  Abdominal:     General: Abdomen is flat.     Palpations: Abdomen is soft.  Genitourinary:    Comments: deferred Musculoskeletal:     Cervical back: Normal range of motion and neck supple.     Comments: Examination the right hip reveals no skin wounds or lesions. No significant trochanteric tenderness to palpation. She has restricted range of motion. She has severe pain with terminal flexion and rotation. Severe pain in the position of impingement. Positive Stinchfield.  She ambulates with a severely antalgic gait.  Neurovascular intact  Skin:    General: Skin is warm and dry.     Capillary Refill: Capillary refill takes less than 2 seconds.  Neurological:     General: No focal deficit present.     Mental Status: She is alert and oriented to person, place, and time.  Psychiatric:        Mood and Affect: Mood normal.        Behavior: Behavior normal.        Thought Content: Thought content normal.        Judgment: Judgment normal.     Vital signs in last 24 hours: @VSRANGES @  Labs:   Estimated body mass index is 28.83 kg/m as calculated from the following:   Height as of 04/07/23: 5\' 2"  (1.575 m).   Weight as of  04/07/23: 71.5 kg.   Imaging Review Plain radiographs demonstrate severe degenerative joint disease of the right hip(s). The bone quality appears to be adequate for age and reported activity level.      Assessment/Plan:  End stage arthritis, right hip(s)  The patient history, physical examination, clinical judgement of the provider and imaging studies are consistent  with end stage degenerative joint disease of the right hip(s) and total hip arthroplasty is deemed medically necessary. The treatment options including medical management, injection therapy, arthroscopy and arthroplasty were discussed at length. The risks and benefits of total hip arthroplasty were presented and reviewed. The risks due to aseptic loosening, infection, stiffness, dislocation/subluxation,  thromboembolic complications and other imponderables were discussed.  The patient acknowledged the explanation, agreed to proceed with the plan and consent was signed. Patient is being admitted for inpatient treatment for surgery, pain control, PT, OT, prophylactic antibiotics, VTE prophylaxis, progressive ambulation and ADL's and discharge planning.The patient is planning to be discharged home with HEP.   Therapy Plans: HEP.  Disposition: Home with husband Bill Planned DVT Prophylaxis: aspirin 81mg  BID DME needed: walker. PCP: Cleared.  TXA: IV Allergies:  - Penicillin - tongue swelling.  - Prochlorperazine - GI intolerance.  - Venlafaxine - itching.  Anesthesia Concerns: None.  BMI: 28 Last HgbA1c: Not diabetic. Other: - Restless Leg, ropinirole.  - Hydrocodone, zofran, methocarbamol.  [facility].  - 03/17/23: Hgb 13.5, Cr. 0.75, K+ 3.8.    Patient's anticipated LOS is less than 2 midnights, meeting these requirements: - Younger than 51 - Lives within 1 hour of care - Has a competent adult at home to recover with post-op recover - NO history of  - Chronic pain requiring opiods  - Diabetes  -  Coronary Artery Disease  - Heart failure  - Heart attack  - Stroke  - DVT/VTE  - Cardiac arrhythmia  - Respiratory Failure/COPD  - Renal failure  - Anemia  - Advanced Liver disease

## 2023-04-08 NOTE — H&P (View-Only) (Signed)
TOTAL HIP ADMISSION H&P  Patient is admitted for right total hip arthroplasty.  Subjective:  Chief Complaint: right hip pain  HPI: [name], 62 y.o. female, has a history of pain and functional disability in the right hip(s) due to arthritis and patient has failed non-surgical conservative treatments for greater than 12 weeks to include NSAID's and/or analgesics, flexibility and strengthening excercises, use of assistive devices, and activity modification.  Onset of symptoms was gradual starting 9 years ago with rapidlly worsening course since that time.The patient noted no past surgery on the right hip(s).  Patient currently rates pain in the right hip at 10 out of 10 with activity. Patient has night pain, worsening of pain with activity and weight bearing, trendelenberg gait, pain that interfers with activities of daily living, and pain with passive range of motion. Patient has evidence of subchondral cysts, subchondral sclerosis, periarticular osteophytes, and joint space narrowing by imaging studies. This condition presents safety issues increasing the risk of falls.  There is no current active infection.  Patient Active Problem List   Diagnosis Date Noted   Hiatal hernia 03/13/2023   Chronic cholecystitis with calculus 02/20/2014   Constipation, chronic 02/20/2014   Menopause    Renal calculus or stone    Lactose intolerance    Nonspecific (abnormal) findings on radiological and other examination of body structure 03/16/2009   ABNORMAL CHEST XRAY 03/16/2009   DEPRESSION 03/15/2009   HYPERTENSION 03/15/2009   Past Medical History:  Diagnosis Date   Arthritis    upper back & hips - OA   Depression    GERD (gastroesophageal reflux disease)    History of kidney stones    Hypertension    Lactose intolerance    Menopause    PONV (postoperative nausea and vomiting)    Sleep disorder    had sleep study, was told that she has restless leg syndrome, states that she has just  learned to live with it, no Rx thus far.     Past Surgical History:  Procedure Laterality Date   ABDOMINAL HYSTERECTOMY  09/29/2007   total   ABDOMINAL SURGERY     "tummy tuck "   CESAREAN SECTION  12/28/1981   EYE SURGERY Bilateral    lasik   glaucoma eye surgery      LAPAROSCOPIC CHOLECYSTECTOMY SINGLE PORT N/A 03/27/2014   Procedure: LAPAROSCOPIC CHOLECYSTECTOMY SINGLE PORT WITH INTRAOPERATIVE CHOLANGIOGRAM ;  Surgeon: Karie Soda, MD;  Location: [facility];  Service: General;  Laterality: N/A;   VAGINAL DELIVERY     prior to C/Section    Current Outpatient Medications  Medication Sig Dispense Refill Last Dose   acetaminophen (TYLENOL) 500 MG tablet Take 1,000 mg by mouth every 6 (six) hours as needed (pain.).      benzonatate (TESSALON) 100 MG capsule Take 100 mg by mouth 3 (three) times daily as needed for cough.      Biotin 1000 MCG tablet Take 1,000 mcg by mouth daily.      buPROPion (WELLBUTRIN XL) 300 MG 24 hr tablet Take 300 mg by mouth in the morning.      lisinopril (ZESTRIL) 10 MG tablet Take 10 mg by mouth in the morning.      Melatonin 10 MG TABS Take 10 mg by mouth at bedtime.      naproxen sodium (ALEVE) 220 MG tablet Take 220-440 mg by mouth 2 (two) times daily as needed (pain.).      omeprazole (PRILOSEC) 40 MG capsule Take 40 mg  by mouth in the morning.      PRISTIQ 100 MG 24 hr tablet Take 100 mg by mouth in the morning.      rOPINIRole (REQUIP) 1 MG tablet Take 1 mg by mouth in the morning and at bedtime.      rosuvastatin (CRESTOR) 10 MG tablet Take 10 mg by mouth in the morning.      sucralfate (CARAFATE) 1 g tablet Take 1 g by mouth 4 (four) times daily.      No current facility-administered medications for this visit.   Allergies  Allergen Reactions   Penicillins Swelling    tongue   Compazine    Prochlorperazine Edisylate Diarrhea   Venlafaxine Itching    Pristiq is tolerated.  Effexor was used on recommendation by Teacher, English as a foreign language    Social History    Tobacco Use   Smoking status: Never   Smokeless tobacco: Never  Substance Use Topics   Alcohol use: No    Family History  Problem Relation Age of Onset   Cancer Father        brown lung     Review of Systems  Musculoskeletal:  Positive for arthralgias and gait problem.  All other systems reviewed and are negative.   Objective:  Physical Exam Constitutional:      Appearance: Normal appearance.  HENT:     Head: Normocephalic and atraumatic.     Nose: Nose normal.     Mouth/Throat:     Mouth: Mucous membranes are moist.     Pharynx: Oropharynx is clear.  Eyes:     Conjunctiva/sclera: Conjunctivae normal.  Cardiovascular:     Rate and Rhythm: Normal rate and regular rhythm.     Pulses: Normal pulses.     Heart sounds: Normal heart sounds.  Abdominal:     General: Abdomen is flat.     Palpations: Abdomen is soft.  Genitourinary:    Comments: deferred Musculoskeletal:     Cervical back: Normal range of motion and neck supple.     Comments: Examination the right hip reveals no skin wounds or lesions. No significant trochanteric tenderness to palpation. She has restricted range of motion. She has severe pain with terminal flexion and rotation. Severe pain in the position of impingement. Positive Stinchfield.  She ambulates with a severely antalgic gait.  Neurovascular intact  Skin:    General: Skin is warm and dry.     Capillary Refill: Capillary refill takes less than 2 seconds.  Neurological:     General: No focal deficit present.     Mental Status: She is alert and oriented to person, place, and time.  Psychiatric:        Mood and Affect: Mood normal.        Behavior: Behavior normal.        Thought Content: Thought content normal.        Judgment: Judgment normal.     Vital signs in last 24 hours: @VSRANGES @  Labs:   Estimated body mass index is 28.83 kg/m as calculated from the following:   Height as of 04/07/23: 5\' 2"  (1.575 m).   Weight as of  04/07/23: 71.5 kg.   Imaging Review Plain radiographs demonstrate severe degenerative joint disease of the right hip(s). The bone quality appears to be adequate for age and reported activity level.      Assessment/Plan:  End stage arthritis, right hip(s)  The patient history, physical examination, clinical judgement of the provider and imaging studies are consistent  with end stage degenerative joint disease of the right hip(s) and total hip arthroplasty is deemed medically necessary. The treatment options including medical management, injection therapy, arthroscopy and arthroplasty were discussed at length. The risks and benefits of total hip arthroplasty were presented and reviewed. The risks due to aseptic loosening, infection, stiffness, dislocation/subluxation,  thromboembolic complications and other imponderables were discussed.  The patient acknowledged the explanation, agreed to proceed with the plan and consent was signed. Patient is being admitted for inpatient treatment for surgery, pain control, PT, OT, prophylactic antibiotics, VTE prophylaxis, progressive ambulation and ADL's and discharge planning.The patient is planning to be discharged home with HEP.   Therapy Plans: HEP.  Disposition: Home with husband Bill Planned DVT Prophylaxis: aspirin 81mg  BID DME needed: walker. PCP: Cleared.  TXA: IV Allergies:  - Penicillin - tongue swelling.  - Prochlorperazine - GI intolerance.  - Venlafaxine - itching.  Anesthesia Concerns: None.  BMI: 28 Last HgbA1c: Not diabetic. Other: - Restless Leg, ropinirole.  - Hydrocodone, zofran, methocarbamol.  [facility].  - 03/17/23: Hgb 13.5, Cr. 0.75, K+ 3.8.    Patient's anticipated LOS is less than 2 midnights, meeting these requirements: - Younger than 62 - Lives within 1 hour of care - Has a competent adult at home to recover with post-op recover - NO history of  - Chronic pain requiring opiods  - Diabetes  -  Coronary Artery Disease  - Heart failure  - Heart attack  - Stroke  - DVT/VTE  - Cardiac arrhythmia  - Respiratory Failure/COPD  - Renal failure  - Anemia  - Advanced Liver disease

## 2023-04-09 NOTE — Progress Notes (Signed)
Case: [id number] Date/Time: 04/16/23 0715   Procedure: TOTAL HIP ARTHROPLASTY ANTERIOR APPROACH (Right: Hip)   Anesthesia type: Spinal   Pre-op diagnosis: right hip osteoarthritis   Location: [facility] ROOM 08 / [facility]   Surgeons: Samson Frederic, MD       DISCUSSION: [name] is a 62 yo female who presents to PAT prior to surgery above. PMH of hypertension, GERD, arthritis.  Prior anesthesia complications include PONV  Patient saw her PCP on 12/22/2022 for chest pain and epigastric pain.  Thought to be GI etiology however she was referred to cardiology to ensure it was not related to her heart.  EKG in clinic was normal sinus rhythm.  She had initial consult with cardiology on 02/23/2023.  Patient told provider that she was unsure if the chest pain was related to hiatal hernia but it was different from her reflux pain.  A baseline echo and stress test were ordered given her hip issues.  Patient did not have this testing done because she felt it was not cardiac related.  Prior her PCP on a telephone visit on 03/18/2023: "Patient has a history of a significantly large hiatal hernia and gerd was seen on 12/22/2022 with epigastric pain that radiated to her back her symptoms have been occurring for approximately once a month. Patient had nonsignificant office EKG and it was thought that patient's chest pain could be secondary to heartburn but was referred to cardiology to rule out any cardiac abnormalities where patient saw cardiology and had a echo and stress test ordered. However patient followed up with a new GI doctor Dr. Cliffton Asters where patient states that the GI doctor thought that her chest pain was from her hiatal hernia and recommend to have it replaced in the future. Patient canceled the order for echo and stress test. Patient currently needs clearance for orthopedic surgery and is then going to have the hiatal hernia repaired. Spoke to the patient and told patient that I could not fully states that  patient's chest pain is from her hiatal hernia and not from a heart. Patient understands the risk of undergoing surgery without these tests including possible death if chest pain is from the heart however still declined stress test and echo and would like to proceed with the surgery. Recent labs were normal and patient was cleared for surgery."  Discussed case with Dr. Charlynn Grimes who recommends obtaining echo and stress test.  Discussed this with the patient who is agreeable to having the echo done but the stress test would be a significant financial burden.  Echo was done which was normal.  Patient denies having any chest pain in several months.  He is able to do at least 4 METS - she can climb up a flight of stairs without symptoms and do light housework. Per Dr. Charlynn Grimes, ok to proceed with just echo.   VS: BP (!) 135/94   Pulse 87   Temp 36.8 C (Oral)   Resp 16   Ht 5\' 2"  (1.575 m)   Wt 71.5 kg   BMI 28.83 kg/m   PROVIDERS: Lazoff, Shawn P, DO   LABS: Labs reviewed: Acceptable for surgery. (all labs ordered are listed, but only abnormal results are displayed)  Labs Reviewed  SURGICAL PCR SCREEN - Abnormal; Notable for the following components:      Result Value   MRSA, PCR POSITIVE (*)    Staphylococcus aureus POSITIVE (*)    All other components within normal limits  TYPE AND SCREEN  IMAGES:   EKG 02/23/23 (report only in [facility]):  NSR, rate 83  CV:  Echo 04/09/23:  PROCEDURE  A two-dimensional transthoracic echocardiogram with color flow, Doppler and global longitudinal  strain imaging was performed. Image Quality  Fair.  The left ventricular size is normal.  There is mild concentric left ventricular hypertrophy.  Left ventricular systolic function is normal.  LV ejection fraction = 50-55%.  There is trace aortic regurgitation.  There is trace tricuspid regurgitation.  No pulmonary hypertension.  There is no pericardial effusion.  IVC size is small and  collapsing reflecting intravascular volume depletion.  There is no comparison study available.   Past Medical History:  Diagnosis Date   Arthritis    upper back & hips - OA   Depression    GERD (gastroesophageal reflux disease)    History of kidney stones    Hypertension    Lactose intolerance    Menopause    PONV (postoperative nausea and vomiting)    Sleep disorder    had sleep study, was told that she has restless leg syndrome, states that she has just learned to live with it, no Rx thus far.     Past Surgical History:  Procedure Laterality Date   ABDOMINAL HYSTERECTOMY  09/29/2007   total   ABDOMINAL SURGERY     "tummy tuck "   CESAREAN SECTION  12/28/1981   EYE SURGERY Bilateral    lasik   glaucoma eye surgery      LAPAROSCOPIC CHOLECYSTECTOMY SINGLE PORT N/A 03/27/2014   Procedure: LAPAROSCOPIC CHOLECYSTECTOMY SINGLE PORT WITH INTRAOPERATIVE CHOLANGIOGRAM ;  Surgeon: Karie Soda, MD;  Location: [facility] OR;  Service: General;  Laterality: N/A;   VAGINAL DELIVERY     prior to C/Section    MEDICATIONS:  acetaminophen (TYLENOL) 500 MG tablet   benzonatate (TESSALON) 100 MG capsule   Biotin 1000 MCG tablet   buPROPion (WELLBUTRIN XL) 300 MG 24 hr tablet   lisinopril (ZESTRIL) 10 MG tablet   Melatonin 10 MG TABS   naproxen sodium (ALEVE) 220 MG tablet   omeprazole (PRILOSEC) 40 MG capsule   PRISTIQ 100 MG 24 hr tablet   rOPINIRole (REQUIP) 1 MG tablet   rosuvastatin (CRESTOR) 10 MG tablet   sucralfate (CARAFATE) 1 g tablet   No current facility-administered medications for this encounter.   Marcille Blanco [facility] Short Stay/Anesthesiology [facility] Phone [phone] 04/14/2023 9:13 AM

## 2023-04-14 ENCOUNTER — Encounter (HOSPITAL_COMMUNITY): Payer: Self-pay

## 2023-04-14 NOTE — Anesthesia Preprocedure Evaluation (Signed)
Anesthesia Evaluation  Patient identified by MRN, date of birth, ID band Patient awake    Reviewed: Allergy & Precautions, H&P , NPO status , Patient's Chart, lab work & pertinent test results  History of Anesthesia Complications (+) PONV and history of anesthetic complications  Airway Mallampati: II  TM Distance: >3 FB Neck ROM: Full    Dental no notable dental hx.    Pulmonary neg pulmonary ROS   Pulmonary exam normal breath sounds clear to auscultation       Cardiovascular hypertension, Normal cardiovascular exam Rhythm:Regular Rate:Normal     Neuro/Psych  PSYCHIATRIC DISORDERS  Depression    negative neurological ROS     GI/Hepatic Neg liver ROS, hiatal hernia,GERD  ,,  Endo/Other  negative endocrine ROS    Renal/GU Renal disease  negative genitourinary   Musculoskeletal  (+) Arthritis ,    Abdominal   Peds negative pediatric ROS (+)  Hematology negative hematology ROS (+)   Anesthesia Other Findings Plt 229  Reproductive/Obstetrics negative OB ROS                              Anesthesia Physical Anesthesia Plan  ASA: 2  Anesthesia Plan: Spinal   Post-op Pain Management:    Induction:   PONV Risk Score and Plan: Treatment may vary due to age or medical condition  Airway Management Planned: Natural Airway  Additional Equipment:   Intra-op Plan:   Post-operative Plan:   Informed Consent: I have reviewed the patients History and Physical, chart, labs and discussed the procedure including the risks, benefits and alternatives for the proposed anesthesia with the patient or authorized representative who has indicated his/her understanding and acceptance.     Dental advisory given  Plan Discussed with: CRNA  Anesthesia Plan Comments: (See PAT note from 10/15 by K Gekas PA-C   [name] is a 62 yo female who presents to PAT prior to surgery above. PMH of  hypertension, GERD, arthritis.   Prior anesthesia complications include PONV   Patient saw her PCP on 12/22/2022 for chest pain and epigastric pain.  Thought to be GI etiology however she was referred to cardiology to ensure it was not related to her heart.  EKG in clinic was normal sinus rhythm.  She had initial consult with cardiology on 02/23/2023.  Patient told provider that she was unsure if the chest pain was related to hiatal hernia but it was different from her reflux pain.  A baseline echo and stress test were ordered given her hip issues.  Patient did not have this testing done because she felt it was not cardiac related.   Prior her PCP on a telephone visit on 03/18/2023: "Patient has a history of a significantly large hiatal hernia and gerd was seen on 12/22/2022 with epigastric pain that radiated to her back her symptoms have been occurring for approximately once a month. Patient had nonsignificant office EKG and it was thought that patient's chest pain could be secondary to heartburn but was referred to cardiology to rule out any cardiac abnormalities where patient saw cardiology and had a echo and stress test ordered. However patient followed up with a new GI doctor Dr. Cliffton Asters where patient states that the GI doctor thought that her chest pain was from her hiatal hernia and recommend to have it replaced in the future. Patient canceled the order for echo and stress test. Patient currently needs clearance for orthopedic surgery and is  then going to have the hiatal hernia repaired. Spoke to the patient and told patient that I could not fully states that patient's chest pain is from her hiatal hernia and not from a heart. Patient understands the risk of undergoing surgery without these tests including possible death if chest pain is from the heart however still declined stress test and echo and would like to proceed with the surgery. Recent labs were normal and patient was cleared for surgery."    Discussed case with Dr. Charlynn Grimes who recommends obtaining echo and stress test.  Discussed this with the patient who is agreeable to having the echo done but the stress test would be a significant financial burden.  Echo was done which was normal.  Patient denies having any chest pain in several months.  He is able to do at least 4 METS - she can climb up a flight of stairs without symptoms and do light housework. Per Dr. Charlynn Grimes, ok to proceed with just echo. )         Anesthesia Quick Evaluation

## 2023-04-16 ENCOUNTER — Ambulatory Visit (HOSPITAL_COMMUNITY): Payer: 59

## 2023-04-16 ENCOUNTER — Encounter (HOSPITAL_COMMUNITY): Payer: Self-pay | Admitting: Orthopedic Surgery

## 2023-04-16 ENCOUNTER — Other Ambulatory Visit: Payer: Self-pay

## 2023-04-16 ENCOUNTER — Ambulatory Visit (HOSPITAL_COMMUNITY): Payer: 59 | Admitting: Medical

## 2023-04-16 ENCOUNTER — Encounter (HOSPITAL_COMMUNITY)
Admission: RE | Disposition: A | Discharge status: Home / Self Care | Payer: Self-pay | Source: Ambulatory Visit | Attending: Orthopedic Surgery

## 2023-04-16 ENCOUNTER — Ambulatory Visit (HOSPITAL_COMMUNITY)
Admission: RE | Admit: 2023-04-16 | Discharge: 2023-04-16 | Disposition: A | Discharge status: Home / Self Care | Payer: 59 | Source: Ambulatory Visit | Attending: Orthopedic Surgery | Admitting: Orthopedic Surgery

## 2023-04-16 ENCOUNTER — Ambulatory Visit (HOSPITAL_BASED_OUTPATIENT_CLINIC_OR_DEPARTMENT_OTHER): Payer: 59

## 2023-04-16 ENCOUNTER — Other Ambulatory Visit (HOSPITAL_COMMUNITY): Payer: Self-pay

## 2023-04-16 DIAGNOSIS — M1611 Unilateral primary osteoarthritis, right hip: Secondary | ICD-10-CM | POA: Diagnosis present

## 2023-04-16 DIAGNOSIS — K219 Gastro-esophageal reflux disease without esophagitis: Secondary | ICD-10-CM | POA: Insufficient documentation

## 2023-04-16 DIAGNOSIS — K449 Diaphragmatic hernia without obstruction or gangrene: Secondary | ICD-10-CM | POA: Diagnosis not present

## 2023-04-16 DIAGNOSIS — Z01818 Encounter for other preprocedural examination: Secondary | ICD-10-CM

## 2023-04-16 HISTORY — PX: TOTAL HIP ARTHROPLASTY: SHX124

## 2023-04-16 LAB — TYPE AND SCREEN
ABO/RH(D): O POS
Antibody Screen: NEGATIVE

## 2023-04-16 LAB — ABO/RH: ABO/RH(D): O POS

## 2023-04-16 SURGERY — ARTHROPLASTY, HIP, TOTAL, ANTERIOR APPROACH
Anesthesia: Spinal | Site: Hip | Laterality: Right

## 2023-04-16 MED ORDER — MIDAZOLAM HCL 2 MG/2ML IJ SOLN
INTRAMUSCULAR | Status: AC
  Filled 2023-04-16: qty 2

## 2023-04-16 MED ORDER — TRANEXAMIC ACID-NACL 1000-0.7 MG/100ML-% IV SOLN
1000.0000 mg | INTRAVENOUS | Status: AC
  Administered 2023-04-16: 1000 mg via INTRAVENOUS
  Filled 2023-04-16: qty 100

## 2023-04-16 MED ORDER — BUPIVACAINE IN DEXTROSE 0.75-8.25 % IT SOLN
INTRATHECAL | Status: DC | PRN
  Administered 2023-04-16: 1.6 mL via INTRATHECAL

## 2023-04-16 MED ORDER — FENTANYL CITRATE PF 50 MCG/ML IJ SOSY
PREFILLED_SYRINGE | INTRAMUSCULAR | Status: AC
  Administered 2023-04-16: 50 ug via INTRAVENOUS
  Filled 2023-04-16: qty 2

## 2023-04-16 MED ORDER — SODIUM CHLORIDE (PF) 0.9 % IJ SOLN
INTRAMUSCULAR | Status: DC | PRN
  Administered 2023-04-16: 30 mL

## 2023-04-16 MED ORDER — HYDROCODONE-ACETAMINOPHEN 5-325 MG PO TABS
1.0000 | ORAL_TABLET | ORAL | 0 refills | Status: AC | PRN
Start: 2023-04-16 — End: 2023-04-23
  Filled 2023-04-16: qty 42, 7d supply, fill #0

## 2023-04-16 MED ORDER — SENNA 8.6 MG PO TABS
2.0000 | ORAL_TABLET | Freq: Every day | ORAL | 0 refills | Status: AC
Start: 2023-04-16 — End: 2023-05-01
  Filled 2023-04-16: qty 30, 15d supply, fill #0

## 2023-04-16 MED ORDER — ACETAMINOPHEN 500 MG PO TABS
1000.0000 mg | ORAL_TABLET | Freq: Once | ORAL | Status: AC
  Administered 2023-04-16: 1000 mg via ORAL
  Filled 2023-04-16: qty 2

## 2023-04-16 MED ORDER — SODIUM CHLORIDE 0.9 % IR SOLN
Status: DC | PRN
  Administered 2023-04-16 (×2): 1000 mL

## 2023-04-16 MED ORDER — DROPERIDOL 2.5 MG/ML IJ SOLN: 0.6250 mg | Freq: Once | INTRAMUSCULAR | Status: DC | PRN

## 2023-04-16 MED ORDER — PROPOFOL 500 MG/50ML IV EMUL
INTRAVENOUS | Status: DC | PRN
  Administered 2023-04-16: 50 ug/kg/min via INTRAVENOUS

## 2023-04-16 MED ORDER — VANCOMYCIN HCL IN DEXTROSE 1-5 GM/200ML-% IV SOLN
1000.0000 mg | INTRAVENOUS | Status: AC
  Administered 2023-04-16: 1000 mg via INTRAVENOUS
  Filled 2023-04-16: qty 200

## 2023-04-16 MED ORDER — ONDANSETRON HCL 4 MG/2ML IJ SOLN
INTRAMUSCULAR | Status: AC
  Filled 2023-04-16: qty 2

## 2023-04-16 MED ORDER — LACTATED RINGERS IV SOLN: INTRAVENOUS | Status: DC

## 2023-04-16 MED ORDER — OXYCODONE HCL 5 MG PO TABS
ORAL_TABLET | ORAL | Status: AC
  Filled 2023-04-16: qty 1

## 2023-04-16 MED ORDER — KETOROLAC TROMETHAMINE 30 MG/ML IJ SOLN
INTRAMUSCULAR | Status: AC
  Filled 2023-04-16: qty 1

## 2023-04-16 MED ORDER — ACETAMINOPHEN 500 MG PO TABS
1000.0000 mg | ORAL_TABLET | Freq: Three times a day (TID) | ORAL | 0 refills | Status: AC | PRN
  Filled 2023-04-16: qty 30, 5d supply, fill #0

## 2023-04-16 MED ORDER — MUPIROCIN 2 % EX OINT
1.0000 | TOPICAL_OINTMENT | Freq: Two times a day (BID) | CUTANEOUS | 0 refills | Status: AC
  Filled 2023-04-16: qty 44, 22d supply, fill #0

## 2023-04-16 MED ORDER — DOCUSATE SODIUM 100 MG PO CAPS
100.0000 mg | ORAL_CAPSULE | Freq: Two times a day (BID) | ORAL | 0 refills | Status: AC
  Filled 2023-04-16: qty 60, 30d supply, fill #0

## 2023-04-16 MED ORDER — POVIDONE-IODINE 10 % EX SWAB: 2.0000 | Freq: Once | CUTANEOUS | Status: DC

## 2023-04-16 MED ORDER — WATER FOR IRRIGATION, STERILE IR SOLN
Status: DC | PRN
  Administered 2023-04-16: 2000 mL

## 2023-04-16 MED ORDER — OXYCODONE HCL 5 MG PO TABS
5.0000 mg | ORAL_TABLET | Freq: Once | ORAL | Status: AC | PRN
  Administered 2023-04-16: 5 mg via ORAL

## 2023-04-16 MED ORDER — ASPIRIN 81 MG PO CHEW
81.0000 mg | CHEWABLE_TABLET | Freq: Two times a day (BID) | ORAL | 0 refills | Status: AC
  Filled 2023-04-16: qty 90, 45d supply, fill #0

## 2023-04-16 MED ORDER — METHOCARBAMOL 500 MG IVPB - SIMPLE MED
500.0000 mg | Freq: Four times a day (QID) | INTRAVENOUS | Status: DC | PRN
  Administered 2023-04-16: 500 mg via INTRAVENOUS
  Filled 2023-04-16: qty 55

## 2023-04-16 MED ORDER — SODIUM CHLORIDE (PF) 0.9 % IJ SOLN
INTRAMUSCULAR | Status: AC
  Filled 2023-04-16: qty 30

## 2023-04-16 MED ORDER — ACETAMINOPHEN 10 MG/ML IV SOLN
1000.0000 mg | Freq: Once | INTRAVENOUS | Status: DC | PRN
  Administered 2023-04-16: 1000 mg via INTRAVENOUS

## 2023-04-16 MED ORDER — BUPIVACAINE-EPINEPHRINE 0.25% -1:200000 IJ SOLN
INTRAMUSCULAR | Status: AC
  Filled 2023-04-16: qty 1

## 2023-04-16 MED ORDER — METHOCARBAMOL 500 MG PO TABS: 500.0000 mg | ORAL_TABLET | Freq: Four times a day (QID) | ORAL | Status: DC | PRN

## 2023-04-16 MED ORDER — ACETAMINOPHEN 10 MG/ML IV SOLN
INTRAVENOUS | Status: AC
  Filled 2023-04-16: qty 100

## 2023-04-16 MED ORDER — DEXAMETHASONE SODIUM PHOSPHATE 10 MG/ML IJ SOLN
INTRAMUSCULAR | Status: AC
  Filled 2023-04-16: qty 1

## 2023-04-16 MED ORDER — OXYCODONE HCL 5 MG/5ML PO SOLN: 5.0000 mg | Freq: Once | ORAL | Status: AC | PRN

## 2023-04-16 MED ORDER — PROPOFOL 1000 MG/100ML IV EMUL
INTRAVENOUS | Status: AC
  Filled 2023-04-16: qty 100

## 2023-04-16 MED ORDER — FENTANYL CITRATE PF 50 MCG/ML IJ SOSY
25.0000 ug | PREFILLED_SYRINGE | INTRAMUSCULAR | Status: DC | PRN
  Administered 2023-04-16 (×2): 50 ug via INTRAVENOUS

## 2023-04-16 MED ORDER — MUPIROCIN 2 % EX OINT
1.0000 | TOPICAL_OINTMENT | Freq: Two times a day (BID) | CUTANEOUS | 0 refills | Status: AC
  Filled 2023-04-16: qty 22, 11d supply, fill #0

## 2023-04-16 MED ORDER — ISOPROPYL ALCOHOL 70 % SOLN
Status: DC | PRN
  Administered 2023-04-16: 1 via TOPICAL

## 2023-04-16 MED ORDER — ONDANSETRON HCL 4 MG/2ML IJ SOLN
INTRAMUSCULAR | Status: DC | PRN
  Administered 2023-04-16: 4 mg via INTRAVENOUS

## 2023-04-16 MED ORDER — MIDAZOLAM HCL 5 MG/5ML IJ SOLN
INTRAMUSCULAR | Status: DC | PRN
  Administered 2023-04-16 (×2): 2 mg via INTRAVENOUS

## 2023-04-16 MED ORDER — PROPOFOL 10 MG/ML IV BOLUS
INTRAVENOUS | Status: DC | PRN
  Administered 2023-04-16: 30 mg via INTRAVENOUS

## 2023-04-16 MED ORDER — DEXAMETHASONE SODIUM PHOSPHATE 10 MG/ML IJ SOLN
INTRAMUSCULAR | Status: DC | PRN
  Administered 2023-04-16: 5 mg via INTRAVENOUS

## 2023-04-16 MED ORDER — POLYETHYLENE GLYCOL 3350 17 GM/SCOOP PO POWD
17.0000 g | Freq: Every day | ORAL | 0 refills | Status: AC | PRN
  Filled 2023-04-16: qty 238, 14d supply, fill #0

## 2023-04-16 MED ORDER — METHOCARBAMOL 500 MG IVPB - SIMPLE MED
INTRAVENOUS | Status: AC
  Filled 2023-04-16: qty 55

## 2023-04-16 MED ORDER — FENTANYL CITRATE PF 50 MCG/ML IJ SOSY
PREFILLED_SYRINGE | INTRAMUSCULAR | Status: AC
  Filled 2023-04-16: qty 1

## 2023-04-16 MED ORDER — ONDANSETRON HCL 4 MG PO TABS
4.0000 mg | ORAL_TABLET | Freq: Three times a day (TID) | ORAL | 0 refills | Status: DC | PRN
  Filled 2023-04-16: qty 30, 10d supply, fill #0

## 2023-04-16 MED ORDER — CEFAZOLIN SODIUM-DEXTROSE 2-4 GM/100ML-% IV SOLN
2.0000 g | INTRAVENOUS | Status: AC
  Administered 2023-04-16: 2 g via INTRAVENOUS
  Filled 2023-04-16: qty 100

## 2023-04-16 MED ORDER — METHOCARBAMOL 500 MG PO TABS
500.0000 mg | ORAL_TABLET | Freq: Four times a day (QID) | ORAL | 0 refills | Status: DC | PRN
  Filled 2023-04-16: qty 20, 5d supply, fill #0

## 2023-04-16 MED ORDER — ORAL CARE MOUTH RINSE: 15.0000 mL | Freq: Once | OROMUCOSAL | Status: AC

## 2023-04-16 MED ORDER — CHLORHEXIDINE GLUCONATE 0.12 % MT SOLN
15.0000 mL | Freq: Once | OROMUCOSAL | Status: AC
  Administered 2023-04-16: 15 mL via OROMUCOSAL

## 2023-04-16 MED ORDER — CHLORHEXIDINE GLUCONATE 4 % EX SOLN
1.0000 | CUTANEOUS | 1 refills | Status: DC
  Filled 2023-04-16: qty 946, 30d supply, fill #0

## 2023-04-16 SURGICAL SUPPLY — 58 items
ACE SHELL 3H 52 E HIP (Shell) ×1 IMPLANT
ADH SKN CLS APL DERMABOND .7 (GAUZE/BANDAGES/DRESSINGS) ×2
APL PRP STRL LF DISP 70% ISPRP (MISCELLANEOUS) ×1
BAG COUNTER SPONGE SURGICOUNT (BAG) IMPLANT
BAG SPEC THK2 15X12 ZIP CLS (MISCELLANEOUS)
BAG SPNG CNTER NS LX DISP (BAG)
BAG ZIPLOCK 12X15 (MISCELLANEOUS) IMPLANT
CHLORAPREP W/TINT 26 (MISCELLANEOUS) ×1 IMPLANT
COVER PERINEAL POST (MISCELLANEOUS) ×1 IMPLANT
COVER SURGICAL LIGHT HANDLE (MISCELLANEOUS) ×1 IMPLANT
DERMABOND ADVANCED .7 DNX12 (GAUZE/BANDAGES/DRESSINGS) ×2 IMPLANT
DRAPE IMP U-DRAPE 54X76 (DRAPES) ×1 IMPLANT
DRAPE SHEET LG 3/4 BI-LAMINATE (DRAPES) ×3 IMPLANT
DRAPE STERI IOBAN 125X83 (DRAPES) ×1 IMPLANT
DRAPE U-SHAPE 47X51 STRL (DRAPES) ×1 IMPLANT
DRSG AQUACEL AG ADV 3.5X10 (GAUZE/BANDAGES/DRESSINGS) ×1 IMPLANT
ELECT REM PT RETURN 15FT ADLT (MISCELLANEOUS) ×1 IMPLANT
GAUZE SPONGE 4X4 12PLY STRL (GAUZE/BANDAGES/DRESSINGS) ×1 IMPLANT
GLOVE BIO SURGEON STRL SZ7 (GLOVE) ×1 IMPLANT
GLOVE BIO SURGEON STRL SZ8.5 (GLOVE) ×2 IMPLANT
GLOVE BIOGEL PI IND STRL 7.5 (GLOVE) ×1 IMPLANT
GLOVE BIOGEL PI IND STRL 8.5 (GLOVE) ×1 IMPLANT
GOWN SPEC L3 XXLG W/TWL (GOWN DISPOSABLE) ×1 IMPLANT
GOWN STRL REUS W/ TWL XL LVL3 (GOWN DISPOSABLE) ×1 IMPLANT
GOWN STRL REUS W/TWL XL LVL3 (GOWN DISPOSABLE) ×1
HANDPIECE INTERPULSE COAX TIP (DISPOSABLE) ×1
HEAD CERAMIC BIOLOX 36MM (Head) IMPLANT
HIP SLEEVE BIOLOX -6MM OFFSET (Sleeve) ×1 IMPLANT
HOLDER FOLEY CATH W/STRAP (MISCELLANEOUS) ×1 IMPLANT
HOOD PEEL AWAY T7 (MISCELLANEOUS) ×3 IMPLANT
KIT TURNOVER KIT A (KITS) IMPLANT
LINER ACE G7 HIGH 36 SZ E (Liner) IMPLANT
MANIFOLD NEPTUNE II (INSTRUMENTS) ×1 IMPLANT
MARKER SKIN DUAL TIP RULER LAB (MISCELLANEOUS) ×1 IMPLANT
NDL SAFETY ECLIPSE 18X1.5 (NEEDLE) ×1 IMPLANT
NDL SPNL 18GX3.5 QUINCKE PK (NEEDLE) ×1 IMPLANT
NEEDLE SPNL 18GX3.5 QUINCKE PK (NEEDLE) ×1 IMPLANT
PACK ANTERIOR HIP CUSTOM (KITS) ×1 IMPLANT
SAW OSC TIP CART 19.5X105X1.3 (SAW) ×1 IMPLANT
SEALER BIPOLAR AQUA 6.0 (INSTRUMENTS) ×1 IMPLANT
SET HNDPC FAN SPRY TIP SCT (DISPOSABLE) ×1 IMPLANT
SHELL ACETAB 3H 52 E HIP (Shell) IMPLANT
SLEEVE HIP BIOLOX -6MM OFFSET (Sleeve) IMPLANT
SOLUTION PRONTOSAN WOUND 350ML (IRRIGATION / IRRIGATOR) ×1 IMPLANT
SPIKE FLUID TRANSFER (MISCELLANEOUS) ×1 IMPLANT
STEM FEM CMTLS HO 97.5 6 133D (Stem) IMPLANT
SUT MNCRL AB 3-0 PS2 18 (SUTURE) ×1 IMPLANT
SUT MON AB 2-0 CT1 36 (SUTURE) ×1 IMPLANT
SUT STRATAFIX PDO 1 14 VIOLET (SUTURE) ×1
SUT STRATFX PDO 1 14 VIOLET (SUTURE) ×1
SUT VIC AB 2-0 CT1 27 (SUTURE) ×1
SUT VIC AB 2-0 CT1 TAPERPNT 27 (SUTURE) IMPLANT
SUTURE STRATFX PDO 1 14 VIOLET (SUTURE) ×1 IMPLANT
SYR 3ML LL SCALE MARK (SYRINGE) ×1 IMPLANT
TRAY FOLEY MTR SLVR 14FR STAT (SET/KITS/TRAYS/PACK) IMPLANT
TRAY FOLEY MTR SLVR 16FR STAT (SET/KITS/TRAYS/PACK) IMPLANT
TUBE SUCTION HIGH CAP CLEAR NV (SUCTIONS) ×1 IMPLANT
WATER STERILE IRR 1000ML POUR (IV SOLUTION) ×1 IMPLANT

## 2023-04-16 NOTE — Interval H&P Note (Signed)
History and Physical Interval Note:  04/16/2023 6:39 AM  [name]  has presented today for surgery, with the diagnosis of right hip osteoarthritis.  The various methods of treatment have been discussed with the patient and family. After consideration of risks, benefits and other options for treatment, the patient has consented to  Procedure(s): TOTAL HIP ARTHROPLASTY ANTERIOR APPROACH (Right) as a surgical intervention.  The patient's history has been reviewed, patient examined, no change in status, stable for surgery.  I have reviewed the patient's chart and labs.  Questions were answered to the patient's satisfaction.    The risks, benefits, and alternatives were discussed with the patient. There are risks associated with the surgery including, but not limited to, problems with anesthesia (death), infection, instability (giving out of the joint), dislocation, differences in leg length/angulation/rotation, fracture of bones, loosening or failure of implants, hematoma (blood accumulation) which may require surgical drainage, blood clots, pulmonary embolism, nerve injury (foot drop and lateral thigh numbness), and blood vessel injury. The patient understands these risks and elects to proceed.    Iline Oven Sakira Dahmer

## 2023-04-16 NOTE — Op Note (Signed)
OPERATIVE REPORT  SURGEON: Samson Frederic, MD   ASSISTANT: Clint Bolder, PA-C.  PREOPERATIVE DIAGNOSIS: Post-traumatic Right hip arthritis.   POSTOPERATIVE DIAGNOSIS: Post-traumatic Right hip arthritis.   PROCEDURE: Right total hip arthroplasty, anterior approach.   IMPLANTS: Biomet Taperloc Complete Microplasty stem, size 6 x 97.5 mm, high offset. Biomet G7 OsseoTi Cup, size 52 mm. Biomet Vivacit-E liner, size 36 mm, E, neutral. Biomet Biolox ceramic head ball, size 36 - 6 mm.  ANESTHESIA:  MAC and Spinal  ESTIMATED BLOOD LOSS:-400 mL    ANTIBIOTICS: 2g Ancef.  DRAINS: None.  COMPLICATIONS: None.   CONDITION: PACU - hemodynamically stable.   BRIEF CLINICAL NOTE: [name] is a 62 y.o. female with a long-standing history of Right hip arthritis. After failing conservative management, the patient was indicated for total hip arthroplasty. The risks, benefits, and alternatives to the procedure were explained, and the patient elected to proceed.  PROCEDURE IN DETAIL: Surgical site was marked by myself in the pre-op holding area. Once inside the operating room, spinal anesthesia was obtained, and a foley catheter was inserted. The patient was then positioned on the Hana table.  All bony prominences were well padded.  The hip was prepped and draped in the normal sterile surgical fashion.  A time-out was called verifying side and site of surgery. The patient received IV antibiotics within 60 minutes of beginning the procedure.   Bikini incision was made, and superficial dissection was performed lateral to the ASIS. The direct anterior approach to the hip was performed through the Hueter interval.  Lateral femoral circumflex vessels were treated with the Auqumantys. The anterior capsule was exposed and an inverted T capsulotomy was made. The femoral neck cut was made to the level of the templated cut.  A corkscrew was placed into the head and the head was removed.  The femoral  head was found to have eburnated bone. The head was passed to the back table and was measured. Pubofemoral ligament was released off of the calcar, taking care to stay on bone. Superior capsule was released from the greater trochanter, taking care to stay lateral to the posterior border of the femoral neck in order to preserve the short external rotators.   Acetabular exposure was achieved, and the pulvinar and labrum were excised. Sequential reaming of the acetabulum was then performed up to a size 51 mm reamer. A 52 mm cup was then opened and impacted into place at approximately 40 degrees of abduction and 20 degrees of anteversion. The final polyethylene liner was impacted into place and acetabular osteophytes were removed.    I then gained femoral exposure taking care to protect the abductors and greater trochanter.  This was performed using standard external rotation, extension, and adduction.  A cookie cutter was used to enter the femoral canal, and then the femoral canal finder was placed.  Sequential broaching was performed up to a size 6.  Calcar planer was used on the femoral neck remnant.  I placed a high offset neck and a trial head ball.  The hip was reduced.  Leg lengths and offset were checked fluoroscopically.  The hip was dislocated and trial components were removed.  The final implants were placed, and the hip was reduced.  Fluoroscopy was used to confirm component position and leg lengths.  At 90 degrees of external rotation and full extension, the hip was stable to an anterior directed force.   The wound was copiously irrigated with Prontosan solution and normal saline using pulse  lavage.  Marcaine solution was injected into the periarticular soft tissue.  The wound was closed in layers using #1 Vicryl and V-Loc for the fascia, 2-0 Vicryl for the subcutaneous fat, 2-0 Monocryl for the deep dermal layer, 3-0 running Monocryl subcuticular stitch, and Dermabond for the skin.  Once the glue was  fully dried, an Aquacell Ag dressing was applied.  The patient was transported to the recovery room in stable condition.  Sponge, needle, and instrument counts were correct at the end of the case x2.  The patient tolerated the procedure well and there were no known complications.  The aquamantis was utilized for this case to help facilitate better hemostasis as patient was felt to be at increased risk of bleeding because of complex case requiring increased OR time and/or exposure.   A oscillating saw tip was utilized for this case to prevent damage to the soft tissue structures such as muscles, ligaments and tendons, and to ensure accurate bone cuts. This patient was at increased risk for above structures due to  minimally invasive approach.   Please note that a surgical assistant was a medical necessity for this procedure to perform it in a safe and expeditious manner. Assistant was necessary to provide appropriate retraction of vital neurovascular structures, to prevent femoral fracture, and to allow for anatomic placement of the prosthesis.

## 2023-04-16 NOTE — Evaluation (Signed)
Physical Therapy Evaluation Patient Details Name: [name] MRN: [id number] DOB: 05/13/1961 Today's Date: 04/16/2023  History of Present Illness  Pt s/p R THR  Clinical Impression  Pt s/p R THR and presents with decreased R LE strength/ROM and post op pain limiting functional mobility.  This date, pt up to ambulate in hall, negotiated stair, and performed HEP with written instruction provided and reviewed.  Pt eager for dc home this date.      If plan is discharge home, recommend the following: A little help with walking and/or transfers;A little help with bathing/dressing/bathroom;Assist for transportation;Help with stairs or ramp for entrance;Assistance with cooking/housework   Can travel by private Tax inspector (2 wheels)  Recommendations for Other Services       Functional Status Assessment Patient has had a recent decline in their functional status and demonstrates the ability to make significant improvements in function in a reasonable and predictable amount of time.     Precautions / Restrictions Precautions Precautions: Fall Restrictions Weight Bearing Restrictions: No      Mobility  Bed Mobility Overal bed mobility: Needs Assistance Bed Mobility: Supine to Sit     Supine to sit: Contact guard     General bed mobility comments: for safety only    Transfers Overall transfer level: Needs assistance Equipment used: Rolling walker (2 wheels) Transfers: Sit to/from Stand Sit to Stand: Min assist, Contact guard assist           General transfer comment: cues for LE management and use of UEs to self assist    Ambulation/Gait Ambulation/Gait assistance: Min assist, Contact guard assist Gait Distance (Feet): 110 Feet Assistive device: Rolling walker (2 wheels) Gait Pattern/deviations: Step-to pattern, Decreased step length - right, Decreased step length - left, Shuffle, Trunk flexed Gait velocity: decr      General Gait Details: cues for sequence, posture and position from RW  Stairs Stairs: Yes Stairs assistance: Min assist Stair Management: No rails, Alternating pattern, Forwards, With walker Number of Stairs: 2 General stair comments: single step twice with RW and cues for sequence  Wheelchair Mobility     Tilt Bed    Modified Rankin (Stroke Patients Only)       Balance Overall balance assessment: Needs assistance Sitting-balance support: No upper extremity supported, Feet supported Sitting balance-Leahy Scale: Good     Standing balance support: Bilateral upper extremity supported Standing balance-Leahy Scale: Poor                               Pertinent Vitals/Pain Pain Assessment Pain Assessment: 0-10 Pain Score: 6  Pain Location: R hip/thigh Pain Descriptors / Indicators: Burning, Sore Pain Intervention(s): Limited activity within patient's tolerance, Monitored during session, Premedicated before session    Home Living Family/patient expects to be discharged to:: Private residence Living Arrangements: Spouse/significant other Available Help at Discharge: Family Type of Home: House Home Access: Stairs to enter Entrance Stairs-Rails: None Secretary/administrator of Steps: 1   Home Layout: One level Home Equipment: None      Prior Function Prior Level of Function : Independent/Modified Independent                     Extremity/Trunk Assessment   Upper Extremity Assessment Upper Extremity Assessment: Overall WFL for tasks assessed    Lower Extremity Assessment Lower Extremity Assessment: RLE deficits/detail RLE Deficits / Details:  2/5 strength at hip with AAROM at hip to 90 flex and 20 abd    Cervical / Trunk Assessment Cervical / Trunk Assessment: Normal  Communication   Communication Communication: No apparent difficulties  Cognition Arousal: Alert Behavior During Therapy: WFL for tasks assessed/performed Overall  Cognitive Status: Within Functional Limits for tasks assessed                                          General Comments      Exercises Total Joint Exercises Ankle Circles/Pumps: AROM, Both, 15 reps, Supine Quad Sets: AROM, Both, 10 reps, Supine Heel Slides: AAROM, Right, 20 reps, Supine Hip ABduction/ADduction: AAROM, Right, 15 reps, Supine Long Arc Quad: AAROM, Right, 5 reps, Seated   Assessment/Plan    PT Assessment Patient needs continued PT services  PT Problem List Decreased strength;Decreased range of motion;Decreased activity tolerance;Decreased balance;Decreased mobility;Decreased knowledge of use of DME;Pain       PT Treatment Interventions DME instruction;Gait training;Stair training;Functional mobility training;Therapeutic activities;Therapeutic exercise;Patient/family education    PT Goals (Current goals can be found in the Care Plan section)  Acute Rehab PT Goals Patient Stated Goal: REgain IND PT Goal Formulation: With patient Time For Goal Achievement: 04/23/23 Potential to Achieve Goals: Good    Frequency Min 1X/week     Co-evaluation               AM-PAC PT "6 Clicks" Mobility  Outcome Measure Help needed turning from your back to your side while in a flat bed without using bedrails?: A Little Help needed moving from lying on your back to sitting on the side of a flat bed without using bedrails?: A Little Help needed moving to and from a bed to a chair (including a wheelchair)?: A Little Help needed standing up from a chair using your arms (e.g., wheelchair or bedside chair)?: A Little Help needed to walk in hospital room?: A Little Help needed climbing 3-5 steps with a railing? : A Little 6 Click Score: 18    End of Session Equipment Utilized During Treatment: Gait belt Activity Tolerance: Patient tolerated treatment well Patient left: in chair;with call bell/phone within reach;with family/visitor present Nurse Communication:  Mobility status PT Visit Diagnosis: Difficulty in walking, not elsewhere classified (R26.2)    Time: 1610-9604 PT Time Calculation (min) (ACUTE ONLY): 54 min   Charges:   PT Evaluation $PT Eval Low Complexity: 1 Low PT Treatments $Gait Training: 8-22 mins $Therapeutic Exercise: 8-22 mins PT General Charges $$ ACUTE PT VISIT: 1 Visit         Mauro Kaufmann PT Acute Rehabilitation Services Pager [phone] Office [phone]   Darianne Muralles 04/16/2023, 1:58 PM

## 2023-04-16 NOTE — Discharge Instructions (Signed)
? ?Dr. Brian Swinteck ?Joint Replacement Specialist ?[facility] ?[address] ?[place], [name] [address] ?[phone] ? ? ?TOTAL HIP REPLACEMENT POSTOPERATIVE DIRECTIONS ? ? ? ?Hip Rehabilitation, Guidelines Following Surgery  ? ?WEIGHT BEARING ?Weight bearing as tolerated with assist device (walker, cane, etc) as directed, use it as long as suggested by your surgeon or therapist, typically at least 4-6 weeks. ? ?The results of a hip operation are greatly improved after range of motion and muscle strengthening exercises. Follow all safety measures which are given to protect your hip. If any of these exercises cause increased pain or swelling in your joint, decrease the amount until you are comfortable again. Then slowly increase the exercises. Call your caregiver if you have problems or questions.  ? ?HOME CARE INSTRUCTIONS  ?Most of the following instructions are designed to prevent the dislocation of your new hip.  ?Remove items at home which could result in a fall. This includes throw rugs or furniture in walking pathways.  ?Continue medications as instructed at time of discharge. ?You may have some home medications which will be placed on hold until you complete the course of blood thinner medication. ?You may start showering once you are discharged home. Do not remove your dressing. ?Do not put on socks or shoes without following the instructions of your caregivers.   ?Sit on chairs with arms. Use the chair arms to help push yourself up when arising.  ?Arrange for the use of a toilet seat elevator so you are not sitting low.  ?Walk with walker as instructed.  ?You may resume a sexual relationship in one month or when given the OK by your caregiver.  ?Use walker as long as suggested by your caregivers.  ?You may put full weight on your legs and walk as much as is comfortable. ?Avoid periods of inactivity such as sitting longer than an hour when not asleep. This helps prevent blood  clots.  ?You may return to work once you are cleared by your surgeon.  ?Do not drive a car for 6 weeks or until released by your surgeon.  ?Do not drive while taking narcotics.  ?Wear elastic stockings for two weeks following surgery during the day but you may remove then at night.  ?Make sure you keep all of your appointments after your operation with all of your doctors and caregivers. You should call the office at the above phone number and make an appointment for approximately two weeks after the date of your surgery. ?Please pick up a stool softener and laxative for home use as long as you are requiring pain medications. ?ICE to the affected hip every three hours for 30 minutes at a time and then as needed for pain and swelling. Continue to use ice on the hip for pain and swelling from surgery. You may notice swelling that will progress down to the foot and ankle.  This is normal after surgery.  Elevate the leg when you are not up walking on it.   ?It is important for you to complete the blood thinner medication as prescribed by your doctor. ?Continue to use the breathing machine which will help keep your temperature down.  It is common for your temperature to cycle up and down following surgery, especially at night when you are not up moving around and exerting yourself.  The breathing machine keeps your lungs expanded and your temperature down. ? ?RANGE OF MOTION AND STRENGTHENING EXERCISES  ?These exercises are designed to help you   keep full movement of your hip joint. Follow your caregiver's or physical therapist's instructions. Perform all exercises about fifteen times, three times per day or as directed. Exercise both hips, even if you have had only one joint replacement. These exercises can be done on a training (exercise) mat, on the floor, on a table or on a bed. Use whatever works the best and is most comfortable for you. Use music or television while you are exercising so that the exercises are a  pleasant break in your day. This will make your life better with the exercises acting as a break in routine you can look forward to.  ?Lying on your back, slowly slide your foot toward your buttocks, raising your knee up off the floor. Then slowly slide your foot back down until your leg is straight again.  ?Lying on your back spread your legs as far apart as you can without causing discomfort.  ?Lying on your side, raise your upper leg and foot straight up from the floor as far as is comfortable. Slowly lower the leg and repeat.  ?Lying on your back, tighten up the muscle in the front of your thigh (quadriceps muscles). You can do this by keeping your leg straight and trying to raise your heel off the floor. This helps strengthen the largest muscle supporting your knee.  ?Lying on your back, tighten up the muscles of your buttocks both with the legs straight and with the knee bent at a comfortable angle while keeping your heel on the floor.  ? ?SKILLED REHAB INSTRUCTIONS: ?If the patient is transferred to a skilled rehab facility following release from the hospital, a list of the current medications will be sent to the facility for the patient to continue.  When discharged from the skilled rehab facility, please have the facility set up the patient's Home Health Physical Therapy prior to being released. Also, the skilled facility will be responsible for providing the patient with their medications at time of release from the facility to include their pain medication and their blood thinner medication. If the patient is still at the rehab facility at time of the two week follow up appointment, the skilled rehab facility will also need to assist the patient in arranging follow up appointment in our office and any transportation needs. ? ?POST-OPERATIVE OPIOID TAPER INSTRUCTIONS: ?It is important to wean off of your opioid medication as soon as possible. If you do not need pain medication after your surgery it is ok  to stop day one. ?Opioids include: ?Codeine, Hydrocodone(Norco, Vicodin), Oxycodone(Percocet, oxycontin) and hydromorphone amongst others.  ?Long term and even short term use of opiods can cause: ?Increased pain response ?Dependence ?Constipation ?Depression ?Respiratory depression ?And more.  ?Withdrawal symptoms can include ?Flu like symptoms ?Nausea, vomiting ?And more ?Techniques to manage these symptoms ?Hydrate well ?Eat regular healthy meals ?Stay active ?Use relaxation techniques(deep breathing, meditating, yoga) ?Do Not substitute Alcohol to help with tapering ?If you have been on opioids for less than two weeks and do not have pain than it is ok to stop all together.  ?Plan to wean off of opioids ?This plan should start within one week post op of your joint replacement. ?Maintain the same interval or time between taking each dose and first decrease the dose.  ?Cut the total daily intake of opioids by one tablet each day ?Next start to increase the time between doses. ?The last dose that should be eliminated is the evening dose.  ? ? ?MAKE   SURE YOU:  ?Understand these instructions.  ?Will watch your condition.  ?Will get help right away if you are not doing well or get worse. ? ?Pick up stool softner and laxative for home use following surgery while on pain medications. ?Do not remove your dressing. ?The dressing is waterproof--it is OK to take showers. ?Continue to use ice for pain and swelling after surgery. ?Do not use any lotions or creams on the incision until instructed by your surgeon. ?Total Hip Protocol. ? ?

## 2023-04-16 NOTE — Anesthesia Procedure Notes (Signed)
Spinal  Patient location during procedure: OR Start time: 04/16/2023 7:30 AM End time: 04/16/2023 7:35 AM Reason for block: surgical anesthesia Staffing Performed: anesthesiologist  Anesthesiologist: [place] Nation, MD Performed by:  Nation, MD Authorized by:  Nation, MD   Preanesthetic Checklist Completed: patient identified, IV checked, site marked, risks and benefits discussed, surgical consent, monitors and equipment checked, pre-op evaluation and timeout performed Spinal Block Patient position: sitting Prep: DuraPrep Patient monitoring: heart rate, cardiac monitor, continuous pulse ox and blood pressure Approach: midline Location: L4-5 Needle Needle type: Pencan  Needle gauge: 24 G Assessment Sensory level: T6 Additional Notes Functioning IV was confirmed and monitors were applied. Sterile prep and drape, including hand hygiene and sterile gloves were used. The patient was positioned and the spine was prepped. The skin was anesthetized with lidocaine.  Free flow of clear CSF was obtained prior to injecting local anesthetic into the CSF.  The spinal needle aspirated freely following injection.  The needle was carefully withdrawn.  The patient tolerated the procedure well.

## 2023-04-16 NOTE — Anesthesia Postprocedure Evaluation (Signed)
Anesthesia Post Note  Patient: [name]  Procedure(s) Performed: RIGHT TOTAL HIP ARTHROPLASTY ANTERIOR APPROACH (Right: Hip)     Patient location during evaluation: PACU Anesthesia Type: Spinal Level of consciousness: awake and alert Pain management: pain level controlled Vital Signs Assessment: post-procedure vital signs reviewed and stable Respiratory status: spontaneous breathing, nonlabored ventilation, respiratory function stable and patient connected to nasal cannula oxygen Cardiovascular status: blood pressure returned to baseline and stable Postop Assessment: no apparent nausea or vomiting, no backache and no headache Anesthetic complications: no   No notable events documented.  Last Vitals:  Vitals:   04/16/23 1000 04/16/23 1015  BP: 104/88 110/78  Pulse: 79 83  Resp: 16 14  Temp:    SpO2: 99% 93%    Last Pain:  Vitals:   04/16/23 1015  TempSrc:   PainSc: 7                  Edison Nation

## 2023-04-16 NOTE — Transfer of Care (Signed)
Immediate Anesthesia Transfer of Care Note  Patient: [name]  Procedure(s) Performed: RIGHT TOTAL HIP ARTHROPLASTY ANTERIOR APPROACH (Right: Hip)  Patient Location: PACU  Anesthesia Type:Spinal  Level of Consciousness: awake and patient cooperative  Airway & Oxygen Therapy: Patient Spontanous Breathing and Patient connected to face mask  Post-op Assessment: Report given to RN and Post -op Vital signs reviewed and stable  Post vital signs: Reviewed and stable  Last Vitals:  Vitals Value Taken Time  BP 123/83 04/16/23 0940  Temp    Pulse    Resp 17 04/16/23 0942  SpO2    Vitals shown include unfiled device data.  Last Pain:  Vitals:   04/16/23 0616  TempSrc: Oral         Complications: No notable events documented.

## 2023-04-16 NOTE — Anesthesia Procedure Notes (Signed)
Procedure Name: MAC Date/Time: 04/16/2023 7:30 AM  Performed by: Vanessa Hartford, CRNAPre-anesthesia Checklist: Patient identified, Emergency Drugs available, Suction available and Patient being monitored Patient Re-evaluated:Patient Re-evaluated prior to induction Oxygen Delivery Method: Simple face mask

## 2023-04-17 ENCOUNTER — Encounter (HOSPITAL_COMMUNITY): Payer: Self-pay | Admitting: Orthopedic Surgery

## 2023-04-25 ENCOUNTER — Encounter (HOSPITAL_BASED_OUTPATIENT_CLINIC_OR_DEPARTMENT_OTHER): Payer: Self-pay | Admitting: *Deleted

## 2023-04-25 ENCOUNTER — Emergency Department (HOSPITAL_BASED_OUTPATIENT_CLINIC_OR_DEPARTMENT_OTHER)
Admission: EM | Admit: 2023-04-25 | Discharge: 2023-04-25 | Disposition: A | Discharge status: Home / Self Care | Payer: 59 | Attending: Emergency Medicine | Admitting: Emergency Medicine

## 2023-04-25 ENCOUNTER — Other Ambulatory Visit: Payer: Self-pay

## 2023-04-25 DIAGNOSIS — Z7982 Long term (current) use of aspirin: Secondary | ICD-10-CM | POA: Diagnosis not present

## 2023-04-25 DIAGNOSIS — Z96649 Presence of unspecified artificial hip joint: Secondary | ICD-10-CM | POA: Diagnosis not present

## 2023-04-25 DIAGNOSIS — Z79899 Other long term (current) drug therapy: Secondary | ICD-10-CM | POA: Diagnosis not present

## 2023-04-25 DIAGNOSIS — M25551 Pain in right hip: Secondary | ICD-10-CM | POA: Insufficient documentation

## 2023-04-25 DIAGNOSIS — I10 Essential (primary) hypertension: Secondary | ICD-10-CM | POA: Insufficient documentation

## 2023-04-25 DIAGNOSIS — G4700 Insomnia, unspecified: Secondary | ICD-10-CM | POA: Diagnosis not present

## 2023-04-25 MED ORDER — DIAZEPAM 5 MG PO TABS: 5.0000 mg | ORAL_TABLET | Freq: Every evening | ORAL | 0 refills | Status: DC | PRN

## 2023-04-25 MED ORDER — DIAZEPAM 5 MG PO TABS
5.0000 mg | ORAL_TABLET | Freq: Once | ORAL | Status: AC
  Administered 2023-04-25: 5 mg via ORAL
  Filled 2023-04-25: qty 1

## 2023-04-25 MED ORDER — KETOROLAC TROMETHAMINE 30 MG/ML IJ SOLN
30.0000 mg | Freq: Once | INTRAMUSCULAR | Status: AC
  Administered 2023-04-25: 30 mg via INTRAMUSCULAR
  Filled 2023-04-25: qty 1

## 2023-04-25 NOTE — ED Triage Notes (Signed)
Right hip surgery on the 17th by Dr. Veda Canning, pt says that she has been unable to sleep, so she stopped taking the hydrocodone and the muscle relaxer per the [facility] provider. She took tylenol with melatonin tonight. She says that she just can't get comfortable and can't sleep.

## 2023-04-25 NOTE — Discharge Instructions (Signed)
You were seen today for insomnia.  This is likely related to ongoing hip discomfort.  I defer to your orthopedics for ongoing control.  Will try Valium for both muscle relaxation and insomnia.  Do not drive while taking Valium.

## 2023-04-25 NOTE — ED Provider Notes (Signed)
[place] EMERGENCY DEPARTMENT AT [facility] Provider Note   CSN: [id number] Arrival date & time: 04/25/23  0217     History  Chief Complaint  Patient presents with   Hip Pain    [name] is a 62 y.o. female.  HPI     This is a 62 year old female who presents with hip pain and insomnia.  Patient had a hip replacement by Dr. Linna Caprice on 10/17.  Since that time she states she has had issues with ongoing discomfort.  However over the last 3 days she has been unable to sleep at night.  She is unable to tell me whether this is because of pain primarily or just because she cannot get to sleep.  She states that she was taken off of hydrocodone and her muscle relaxer per the orthopedic PA.  As she had a prior history of paradoxical response to hydrocodone I have felt like this might be contributing to her insomnia.  However, she also states that her pain has not been well-controlled.  She is now only on Tylenol.  She has not had any fevers.  No redness at surgical site.  She states "I just cannot get comfortable and I want to sleep but I cannot."  Home Medications Prior to Admission medications   Medication Sig Start Date End Date Taking? Authorizing Provider  acetaminophen (TYLENOL) 500 MG tablet Take 2 tablets (1,000 mg total) by mouth every 8 (eight) hours as needed for mild pain (pain score 1-3), fever or headache (pain.). 04/16/23  Yes Hill, Alain Honey, PA-C  aspirin (ASPIRIN CHILDRENS) 81 MG chewable tablet Chew 1 tablet (81 mg total) by mouth 2 (two) times daily with a meal. 04/16/23 05/31/23 Yes Hill, Alain Honey, PA-C  Biotin 1000 MCG tablet Take 1,000 mcg by mouth daily.   Yes [provider]  diazepam (VALIUM) 5 MG tablet Take 1 tablet (5 mg total) by mouth at bedtime as needed for anxiety. 04/25/23  Yes Adyline Huberty, Mayer Masker, MD  lisinopril (ZESTRIL) 10 MG tablet Take 10 mg by mouth in the morning.   Yes [provider]  Melatonin 10 MG TABS Take 10 mg by  mouth at bedtime.   Yes [provider]  polyethylene glycol powder (GLYCOLAX/MIRALAX) 17 GM/SCOOP powder Dissolve 17 g in liquid and take by mouth daily as needed for mild constipation or moderate constipation. 04/16/23 05/16/23 Yes Hill, Avery S, PA-C  PRISTIQ 100 MG 24 hr tablet Take 100 mg by mouth in the morning. 03/05/11  Yes [provider]  rOPINIRole (REQUIP) 1 MG tablet Take 1 mg by mouth in the morning and at bedtime.   Yes [provider]  rosuvastatin (CRESTOR) 10 MG tablet Take 10 mg by mouth in the morning.   Yes [provider]  senna (SENOKOT) 8.6 MG TABS tablet Take 2 tablets (17.2 mg total) by mouth at bedtime for 15 days. 04/16/23 05/01/23 Yes Hill, Alain Honey, PA-C  sucralfate (CARAFATE) 1 g tablet Take 1 g by mouth 4 (four) times daily.   Yes [provider]  benzonatate (TESSALON) 100 MG capsule Take 100 mg by mouth 3 (three) times daily as needed for cough.    [provider]  buPROPion (WELLBUTRIN XL) 300 MG 24 hr tablet Take 300 mg by mouth in the morning.    [provider]  chlorhexidine (HIBICLENS) 4 % external liquid Apply 15 mLs (1 Application total) topically as directed for 30 doses. Use as directed daily for  5 days every other week for 6 weeks. 04/16/23   Clois Dupes, PA-C  docusate sodium (COLACE) 100 MG capsule Take 1 capsule (100 mg total) by mouth 2 (two) times daily. 04/16/23 05/16/23  Clois Dupes, PA-C  methocarbamol (ROBAXIN) 500 MG tablet Take 1 tablet (500 mg total) by mouth every 6 (six) hours as needed. 04/16/23   Hill, Alain Honey, PA-C  mupirocin ointment (BACTROBAN) 2 % Place 1 Application into the nose 2 (two) times daily for 60 doses. Use as directed 2 times daily for 5 days every other week for 6 weeks. 04/16/23 05/16/23  Clois Dupes, PA-C  naproxen sodium (ALEVE) 220 MG tablet Take 220-440 mg by mouth 2 (two) times daily as needed (pain.).    [provider]  omeprazole (PRILOSEC) 40  MG capsule Take 40 mg by mouth in the morning.    [provider]  ondansetron (ZOFRAN) 4 MG tablet Take 1 tablet (4 mg total) by mouth every 8 (eight) hours as needed for nausea or vomiting. 04/16/23 04/15/24  Clois Dupes, PA-C      Allergies    Penicillins, Compazine, Prochlorperazine edisylate, and Venlafaxine    Review of Systems   Review of Systems  Constitutional:  Negative for fever.  Respiratory:  Negative for shortness of breath.   Cardiovascular:  Negative for chest pain.  Musculoskeletal:        Right hip pain  All other systems reviewed and are negative.   Physical Exam Updated Vital Signs BP 128/71   Pulse 72   Temp 97.9 F (36.6 C) (Oral)   Resp 18   SpO2 95%  Physical Exam Vitals and nursing note reviewed.  Constitutional:      Appearance: She is well-developed. She is not ill-appearing.  HENT:     Head: Normocephalic and atraumatic.  Eyes:     Pupils: Pupils are equal, round, and reactive to light.  Cardiovascular:     Rate and Rhythm: Normal rate and regular rhythm.  Pulmonary:     Effort: Pulmonary effort is normal. No respiratory distress.  Abdominal:     Palpations: Abdomen is soft.  Musculoskeletal:     Cervical back: Neck supple.     Comments: Surgical wound is clean dry and intact, no dehiscence, no redness or erythema, fair range of motion of the right hip, neurovascularly intact distally  Skin:    General: Skin is warm and dry.  Neurological:     Mental Status: She is alert and oriented to person, place, and time.  Psychiatric:        Mood and Affect: Mood normal.     ED Results / Procedures / Treatments   Labs (all labs ordered are listed, but only abnormal results are displayed) Labs Reviewed - No data to display  EKG None  Radiology No results found.  Procedures Procedures    Medications Ordered in ED Medications  diazepam (VALIUM) tablet 5 mg (5 mg Oral Given 04/25/23 0244)  ketorolac (TORADOL) 30 MG/ML  injection 30 mg (30 mg Intramuscular Given 04/25/23 0244)    ED Course/ Medical Decision Making/ A&P                                 Medical Decision Making Risk Prescription drug management.   This patient presents to the ED for concern of right hip pain, insomnia, this involves an extensive number of treatment options, and  is a complaint that carries with it a high risk of complications and morbidity.  I considered the following differential and admission for this acute, potentially life threatening condition.  The differential diagnosis includes uncontrolled postoperative pain, postoperative complication, infection  MDM:    This is a 62 year old female who presents with insomnia and right hip pain.  She is nontoxic and vital signs are reassuring.  Denies fevers.  Incision of the right hip is clean dry and intact.  No signs or symptoms of infection.  Fair range of motion.  Suspect some of her insomnia may be related to uncontrolled pain.  Patient reports that she has had paradoxical responses to some pain medication and muscle relaxants in the past.  Patient was given 1 dose of Toradol given that she is 1 week postop as well as p.o. Valium.  Do not feel she needs imaging or further workup regarding her pain.  On recheck, patient states she feels somewhat better.  Will trial Valium for both muscle relaxant properties and to help her sleep.  She will need to follow-up closely with her orthopedist.  (Labs, imaging, consults)  Labs: I Ordered, and personally interpreted labs.  The pertinent results include: None  Imaging Studies ordered: I ordered imaging studies including none I independently visualized and interpreted imaging. I agree with the radiologist interpretation  Additional history obtained from chart review.  External records from outside source obtained and reviewed including operative notes  Cardiac Monitoring: The patient was not maintained on a cardiac monitor.  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      Medicines Meds ordered this encounter  Medications   diazepam (VALIUM) tablet 5 mg   ketorolac (TORADOL) 30 MG/ML injection 30 mg   diazepam (VALIUM) 5 MG tablet    Sig: Take 1 tablet (5 mg total) by mouth at bedtime as needed for anxiety.    Dispense:  5 tablet    Refill:  0    I have reviewed the patients home medicines and have made adjustments as needed  Problem List / ED Course: Problem List Items Addressed This Visit   None Visit Diagnoses     Pain of right hip    -  Primary   Insomnia, unspecified type                       Final Clinical Impression(s) / ED Diagnoses Final diagnoses:  Pain of right hip  Insomnia, unspecified type    Rx / DC Orders ED Discharge Orders          Ordered    diazepam (VALIUM) 5 MG tablet  At bedtime PRN  04/25/23 0325              Shon Baton, MD 04/25/23 [phone]

## 2023-05-04 ENCOUNTER — Other Ambulatory Visit (HOSPITAL_COMMUNITY): Payer: Self-pay

## 2023-05-08 ENCOUNTER — Ambulatory Visit: Payer: 59 | Admitting: Thoracic Surgery (Cardiothoracic Vascular Surgery)

## 2023-05-08 ENCOUNTER — Encounter: Payer: Self-pay | Admitting: Thoracic Surgery (Cardiothoracic Vascular Surgery)

## 2023-05-08 VITALS — BP 162/90 | HR 86 | Resp 20 | Ht 62.0 in | Wt 156.8 lb

## 2023-05-08 DIAGNOSIS — K449 Diaphragmatic hernia without obstruction or gangrene: Secondary | ICD-10-CM | POA: Diagnosis not present

## 2023-05-08 NOTE — Progress Notes (Signed)
      [address]       [place] [address]             [phone]        LATRINDA CARAVEO [facility] Medical Record #[id number] Date of Birth: April 11, 1961  Referring: Shirleen Schirmer, PA-C Primary Care: Mattie Marlin, DO Primary Cardiologist:None  Reason for visit:   follow-up  History of Present Illness:     62yo female with hiatal hernia presents in follow-up after undergoing hip surgery 3 weeks ago.  She continues to have dysphagia, and positional reflux.  She missed her initial appointment with GI, and has been rescheduled to January  Physical Exam: BP (!) 162/90 (BP Location: Left Arm, Patient Position: Sitting, Cuff Size: Normal)   Pulse 86   Resp 20   Ht 5\' 2"  (1.575 m)   Wt 156 lb 12.8 oz (71.1 kg)   SpO2 95% Comment: RA  BMI 28.68 kg/m   Alert NAD Abdomen, ND      Assessment / Plan:   62yo female with a hiatal hernia. I personally reviewed her imaging from [facility], and she does have a small hiatal hernia. Her EGD report from 7years ago does describe a moderate sized hernia, and she states that she was also told that she had a stricture. What is most concerning to me is the distal esophageal thickening that is evident on the scan. I would like for her to see Dr. Lavon Paganini for further evaluation. If all the findings are benign, given that she may potentially have a stricture, I think that this, in addition to her symptoms would be an indication for hiatal hernia repair with fundoplication.   She is currently recovering from her hip operation, and missed her GI appointment.  I will see her in follow-up after her EGD in early January.  Corliss Skains 05/08/2023 12:18 PM

## 2023-07-09 ENCOUNTER — Ambulatory Visit: Payer: 59 | Admitting: Physician Assistant

## 2023-07-09 ENCOUNTER — Encounter: Payer: Self-pay | Admitting: Physician Assistant

## 2023-07-09 VITALS — BP 150/90 | HR 89 | Ht 62.0 in | Wt 154.0 lb

## 2023-07-09 DIAGNOSIS — K5900 Constipation, unspecified: Secondary | ICD-10-CM | POA: Diagnosis not present

## 2023-07-09 DIAGNOSIS — K5909 Other constipation: Secondary | ICD-10-CM

## 2023-07-09 DIAGNOSIS — K449 Diaphragmatic hernia without obstruction or gangrene: Secondary | ICD-10-CM | POA: Diagnosis not present

## 2023-07-09 DIAGNOSIS — K2100 Gastro-esophageal reflux disease with esophagitis, without bleeding: Secondary | ICD-10-CM | POA: Diagnosis not present

## 2023-07-09 DIAGNOSIS — R1110 Vomiting, unspecified: Secondary | ICD-10-CM

## 2023-07-09 NOTE — Patient Instructions (Addendum)
 Please take your proton pump inhibitor medication, omeprazole   Please take this medication 30 minutes to 1 hour before meals- this makes it more effective.  Avoid spicy and acidic foods Avoid fatty foods Limit your intake of coffee, tea, alcohol , and carbonated drinks Work to maintain a healthy weight Keep the head of the bed elevated at least 3 inches with blocks or a wedge pillow if you are having any nighttime symptoms Stay upright for 2 hours after eating Avoid meals and snacks three to four hours before bedtime  Toileting tips to help with your constipation - Drink at least 64-80 ounces of water /liquid per day. - Establish a time to try to move your bowels every day.  For many people, this is after a cup of coffee or after a meal such as breakfast. - Sit all of the way back on the toilet keeping your back fairly straight and while sitting up, try to rest the tops of your forearms on your upper thighs.   - Raising your feet with a step stool/squatty potty can be helpful to improve the angle that allows your stool to pass through the rectum. - Relax the rectum feeling it bulge toward the toilet water .  If you feel your rectum raising toward your body, you are contracting rather than relaxing. - Breathe in and slowly exhale. Belly breath by expanding your belly towards your belly button. Keep belly expanded as you gently direct pressure down and back to the anus.  A low pitched GRRR sound can assist with increasing intra-abdominal pressure.  (Can also trying to blow on a pinwheel and make it move, this helps with the same belly breathing) - Repeat 3-4 times. If unsuccessful, contract the pelvic floor to restore normal tone and get off the toilet.  Avoid excessive straining. - To reduce excessive wiping by teaching your anus to normally contract, place hands on outer aspect of knees and resist knee movement outward.  Hold 5-10 second then place hands just inside of knees and resist inward  movement of knees.  Hold 5 seconds.  Repeat a few times each way.  Go to the ER if unable to pass gas, severe AB pain, unable to hold down food, any shortness of breath of chest pain.  Miralax  is an osmotic laxative.  It only brings more water  into the stool.  This is safe to take daily.  Can take up to 17 gram of miralax  twice a day.  Mix with juice or coffee.  Start 1 capful at night for 3-4 days and reassess your response in 3-4 days.  You can increase and decrease the dose based on your response.  Remember, it can take up to 3-4 days to take effect OR for the effects to wear off.   I often pair this with benefiber in the morning to help assure the stool is not too loose.

## 2023-07-09 NOTE — Progress Notes (Signed)
 07/09/2023 KASSIAH MCCRORY [id number] 1960-08-11  Referring provider: Tanda Prentice DEL, MD Primary GI doctor: Dr. Shila  ASSESSMENT AND PLAN:     Gastroesophageal Reflux Disease (GERD) with history of hiatal hernia being evaluated by Dr. Shyrl for possible fundoplication presents with Worsening symptoms of regurgitation and sensation of food sliding down slowly. No dysphagia. Currently on Omeprazole  with partial relief. History of EGD 2021 s/p dilation and hiatal hernia 8/28 noncontrast chest CT small sliding hiatal hernia mild distal esophageal wall thickening 9/17 barium swallow feline esophagus.  Distal esophagus small type I hiatal hernia no dysmotility no obvious strictures -Continue omeprazole  40 mg once daily absent sit at night, incline bed, weight loss -Schedule EGD with Dr. Ivana on 07/24/2023 at 8 AM to evaluate for stenosis and assess hiatal hernia. -Can consider TIF procedure with fundoplication repair if patient would like to avoid or try to avoid omeprazole , can consider after EGD. Will schedule EGD. I discussed risks of EGD with patient today, including risk of sedation, bleeding or perforation.  Patient provides understanding and gave verbal consent to proceed.  Constipation Infrequent bowel movements, history of alternating diarrhea and constipation. Occasionally uses Citrucel. -Consider adding Miralax  for better bowel movement consistency.  General Health Maintenance -Continue routine colonoscopy screening as per previous schedule (10-year interval 2031).   Patient Care Team: Lazoff, Shawn P, DO as PCP - General (Family Medicine) Luis Purchase, MD as Consulting Physician (Gastroenterology)  HISTORY OF PRESENT ILLNESS: 63 y.o. female with a past medical history of hypertension, kidney stones, depression, GERD with hiatal hernia, constipation, status post cholecystectomy 2015 and others listed below presents for evaluation of dysphagia, reflux in setting  of hiatal hernia, GERD and previous esophageal stenosis.  Patient referred by Dr. Shyrl for evaluation of distal esophageal thickening seen on barium esophagram. 03/27/2020 patient had EGD and colonoscopy with Dr. 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 04/16/2023 status post right hip replacement Patient is on omeprazole  40 mg once daily, Carafate and Zofran  as needed.  Discussed the use of AI scribe software for clinical note transcription with the patient, who gave verbal consent to proceed.  History of Present Illness   The patient, with a history of hiatal hernia and esophageal stenosis, presents with worsening reflux and regurgitation symptoms for at least a year. They report a sensation of food 'sliding down' slowly, but deny dysphagia or food impaction. The reflux is worse when lying down at night, despite attempts to sleep propped up on pillows. They also report frequent belching and nocturnal choking. Associated symptoms include sternum pain that radiates to the back.  The patient has been on omeprazole , which initially provided relief but has become less effective over time. They occasionally supplement with over-the-counter antacids. They deny use of NSAIDs, caffeine, alcohol , or tobacco.  The patient also reports a history of lactose intolerance and alternating diarrhea and constipation. They have tried  Citrucel for bowel regulation with limited success.  The patient underwent a right hip surgery recently, which was followed by  significant post-operative pain. They found relief with a combination of Tylenol  and PM Tylenol .  The patient had an EGD colonoscopy in 2021 due to difficulty swallowing, which revealed a moderate to large size hiatal hernia and stenosis. The stenosis was managed with dilation. They have not had a repeat endoscopy since.         She  reports that she has never smoked. She has never used smokeless tobacco. She reports that she does not drink alcohol  and does not use drugs.  RELEVANT LABS AND IMAGING:  CBC    Component Value Date/Time   WBC 8.8 01/02/2020 2113   RBC 5.02 01/02/2020 2113   HGB 14.9 01/02/2020 2113   HCT 44.0 01/02/2020 2113   PLT 289 01/02/2020 2113   MCV 87.6 01/02/2020 2113   MCH 29.7 01/02/2020 2113   MCHC 33.9 01/02/2020 2113   RDW 12.3 01/02/2020 2113   LYMPHSABS 3.5 01/02/2020 2113   MONOABS 0.4 01/02/2020 2113   EOSABS 0.2 01/02/2020 2113   BASOSABS 0.0 01/02/2020 2113   No results for input(s): HGB in the last 8760 hours.  CMP     Component Value Date/Time   NA 137 01/02/2020 2113   K 3.3 (L) 01/02/2020 2113   CL 100 01/02/2020 2113   CO2 22 01/02/2020 2113   GLUCOSE 161 (H) 01/02/2020 2113   BUN 16 01/02/2020 2113   CREATININE 0.87 01/02/2020 2113   CREATININE 0.65 04/10/2011 1110   CALCIUM  9.6 01/02/2020 2113   PROT 7.9 01/02/2020 2113   ALBUMIN 4.4 01/02/2020 2113   AST 30 01/02/2020 2113   ALT 21 01/02/2020 2113   ALKPHOS 92 01/02/2020 2113   BILITOT 0.6 01/02/2020 2113   GFRNONAA >60 01/02/2020 2113   GFRAA >60 01/02/2020 2113      Latest Ref Rng & Units 01/02/2020    9:13 PM 06/18/2016    3:50 PM 06/11/2015    2:45 AM  Hepatic Function  Total Protein 6.5 - 8.1 g/dL 7.9  7.6  7.4   Albumin 3.5 - 5.0 g/dL 4.4  4.4  3.9   AST 15 - 41 U/L 30  22  113   ALT 0 - 44 U/L 21  15  86   Alk Phosphatase 38 - 126  U/L 92  73  85   Total Bilirubin 0.3 - 1.2 mg/dL 0.6  0.4  0.9       Current Medications:    Current Outpatient Medications (Cardiovascular):    lisinopril  (ZESTRIL ) 10 MG tablet, Take 10 mg by mouth in the morning.   rosuvastatin  (CRESTOR ) 10 MG tablet, Take 10 mg by mouth in the morning.  Current Outpatient Medications (Respiratory):    benzonatate (TESSALON) 100 MG capsule, Take 100 mg by mouth 3 (three) times daily as needed for cough.  Current Outpatient Medications (Analgesics):    acetaminophen  (TYLENOL ) 500 MG tablet, Take 2 tablets (1,000 mg total) by mouth every 8 (eight) hours as needed for mild pain (pain score 1-3), fever or headache (pain.).   naproxen sodium (ALEVE) 220 MG tablet, Take 220-440 mg by mouth 2 (two) times daily as needed (pain.).   Current Outpatient Medications (Other):    Biotin 1000 MCG tablet, Take 1,000 mcg by mouth daily.   buPROPion (WELLBUTRIN XL) 300 MG 24 hr tablet, Take 300 mg by mouth in the morning.   diazepam  (VALIUM ) 5 MG tablet, Take 1 tablet (5 mg total) by mouth at bedtime as needed for anxiety.   Melatonin 10  MG TABS, Take 10 mg by mouth at bedtime.   methocarbamol  (ROBAXIN ) 500 MG tablet, Take 1 tablet (500 mg total) by mouth every 6 (six) hours as needed.   omeprazole  (PRILOSEC ) 40 MG capsule, Take 40 mg by mouth in the morning.   ondansetron  (ZOFRAN ) 4 MG tablet, Take 1 tablet (4 mg total) by mouth every 8 (eight) hours as needed for nausea or vomiting.   PRISTIQ 100 MG 24 hr tablet, Take 100 mg by mouth in the morning.   rOPINIRole  (REQUIP ) 1 MG tablet, Take 1 mg by mouth in the morning and at bedtime.   sucralfate (CARAFATE) 1 g tablet, Take 1 g by mouth 4 (four) times daily.  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    Allergies:  Allergies  Allergen Reactions   Penicillins Swelling    tongue   Compazine    Prochlorperazine Edisylate Diarrhea   Venlafaxine Itching    Pristiq is tolerated.  Effexor was used on recommendation by insurance coverer     Surgical History:  She  has a past surgical history that includes Abdominal hysterectomy (09/29/2007); Cesarean section (12/28/1981); Abdominal surgery; Eye surgery (Bilateral); Vaginal delivery; Laparoscopic cholecystectomy single port (N/A, 03/27/2014); glaucoma eye surgery ; and Total hip arthroplasty (Right, 04/16/2023). Family History:  Her family history includes Cancer in her father.  REVIEW OF SYSTEMS  : All other systems reviewed and negative except where noted in the History of Present Illness.  PHYSICAL EXAM: BP (!) 150/90   Pulse 89   Ht 5' 2 (1.575 m)   Wt 154 lb (69.9 kg)   BMI 28.17 kg/m  General Appearance: Well nourished, in no apparent distress. Head:   Normocephalic and atraumatic. Eyes:  sclerae anicteric,conjunctive pink  Respiratory: Respiratory effort normal, BS equal bilaterally without rales, rhonchi, wheezing. Cardio: RRR with no MRGs. Peripheral pulses intact.  Abdomen: Soft,  Obese ,active bowel sounds. mild tenderness in the epigastrium. Without guarding and Without rebound. No masses. Rectal: Not evaluated Musculoskeletal: Full ROM, Normal gait. Without edema. Skin:  Dry and intact without significant lesions or rashes Neuro: Alert and  oriented x4;  No focal deficits. Psych:  Cooperative. Normal mood and affect.    Alan JONELLE Coombs, PA-C 2:42 PM

## 2023-07-13 MED ORDER — OMEPRAZOLE 40 MG PO CPDR: 40.0000 mg | DELAYED_RELEASE_CAPSULE | Freq: Two times a day (BID) | ORAL | 0 refills | Status: DC

## 2023-07-13 MED ORDER — HYOSCYAMINE SULFATE 0.125 MG SL SUBL: 0.1250 mg | SUBLINGUAL_TABLET | Freq: Four times a day (QID) | SUBLINGUAL | 1 refills | Status: DC | PRN

## 2023-07-17 ENCOUNTER — Encounter: Payer: Self-pay | Admitting: Gastroenterology

## 2023-07-24 ENCOUNTER — Ambulatory Visit (AMBULATORY_SURGERY_CENTER): Payer: 59 | Admitting: Gastroenterology

## 2023-07-24 ENCOUNTER — Encounter: Payer: Self-pay | Admitting: Gastroenterology

## 2023-07-24 VITALS — BP 117/74 | HR 98 | Temp 97.9°F | Resp 13 | Ht 62.0 in | Wt 154.0 lb

## 2023-07-24 DIAGNOSIS — K2100 Gastro-esophageal reflux disease with esophagitis, without bleeding: Secondary | ICD-10-CM

## 2023-07-24 DIAGNOSIS — K319 Disease of stomach and duodenum, unspecified: Secondary | ICD-10-CM

## 2023-07-24 DIAGNOSIS — K2970 Gastritis, unspecified, without bleeding: Secondary | ICD-10-CM

## 2023-07-24 DIAGNOSIS — K2950 Unspecified chronic gastritis without bleeding: Secondary | ICD-10-CM

## 2023-07-24 DIAGNOSIS — K449 Diaphragmatic hernia without obstruction or gangrene: Secondary | ICD-10-CM | POA: Diagnosis present

## 2023-07-24 MED ORDER — SODIUM CHLORIDE 0.9 % IV SOLN: 500.0000 mL | Freq: Once | INTRAVENOUS | Status: DC

## 2023-07-24 NOTE — Progress Notes (Signed)
Called to room to assist during endoscopic procedure.  Patient ID and intended procedure confirmed with present staff. Received instructions for my participation in the procedure from the performing physician.

## 2023-07-24 NOTE — Op Note (Signed)
[facility] Patient Name: [name] Procedure Date: 07/24/2023 7:16 AM MRN: [id number] Endoscopist: Napoleon Form , MD, [id number] Age: 63 Referring MD:  Date of Birth: 1961-04-16 Gender: Female Account #: [id number] Procedure:                Upper GI endoscopy Indications:              Esophageal reflux symptoms that persist despite                            appropriate therapy, Follow-up of hiatal hernia,                            pre op work up Medicines:                Monitored Anesthesia Care Procedure:                Pre-Anesthesia Assessment:                           - Prior to the procedure, a History and Physical                            was performed, and patient medications and                            allergies were reviewed. The patient's tolerance of                            previous anesthesia was also reviewed. The risks                            and benefits of the procedure and the sedation                            options and risks were discussed with the patient.                            All questions were answered, and informed consent                            was obtained. Prior Anticoagulants: The patient has                            taken no anticoagulant or antiplatelet agents. ASA                            Grade Assessment: II - A patient with mild systemic                            disease. After reviewing the risks and benefits,                            the patient was deemed in satisfactory condition to  undergo the procedure.                           After obtaining informed consent, the endoscope was                            passed under direct vision. Throughout the                            procedure, the patient's blood pressure, pulse, and                            oxygen saturations were monitored continuously. The                            GIF [id number] #[id number] was introduced  through the                            mouth, and advanced to the second part of duodenum.                            The upper GI endoscopy was accomplished without                            difficulty. The patient tolerated the procedure                            well. Scope In: Scope Out: Findings:                 The Z-line was regular and was found 36 cm from the                            incisors.                           Fluid was found in the lower third of the                            esophagus, with tortous lower esophagus.                           A 6 cm sliding hiatal hernia was present.                           Patchy mild inflammation characterized by erosions,                            erythema and friability was found in the entire                            examined stomach. Biopsies were taken with a cold                            forceps for Helicobacter pylori testing.  The cardia and gastric fundus were normal on                            retroflexion.                           The examined duodenum was normal. Complications:            No immediate complications. Estimated Blood Loss:     Estimated blood loss was minimal. Impression:               - Z-line regular, 36 cm from the incisors.                           - Fluid in the lower third of the esophagus.                           - 6 cm hiatal hernia.                           - Gastritis. Biopsied.                           - Normal examined duodenum. Recommendation:           - Patient has a contact number available for                            emergencies. The signs and symptoms of potential                            delayed complications were discussed with the                            patient. Return to normal activities tomorrow.                            Written discharge instructions were provided to the                            patient.                            - Resume previous diet.                           - Continue present medications.                           - Await pathology results.                           - Follow an antireflux regimen.                           - Schedule esophageal manometry next available  appointment to exclude any major esophageal                            motility disorder Napoleon Form, MD 07/24/2023 8:35:01 AM This report has been signed electronically.

## 2023-07-24 NOTE — Progress Notes (Signed)
[facility] History and Physical   Primary Care Physician:  Mattie Marlin, DO   Reason for Procedure:  Hiatal hernia, GERD  Plan:    EGD with possible interventions as needed     HPI: [name] is a very pleasant 63 y.o. female here for EGD for pre op evaluation of worsening GERD, pre op for hiatal hernia repair. Please refer to office visit note by Quentin Mulling for additional details   The risks and benefits as well as alternatives of endoscopic procedure(s) have been discussed and reviewed. All questions answered. The patient agrees to proceed.    Past Medical History:  Diagnosis Date   Arthritis    upper back & hips - OA   Depression    GERD (gastroesophageal reflux disease)    Glaucoma    History of kidney stones    Hyperlipidemia    Hypertension    Kidney stones    Lactose intolerance    Menopause    PONV (postoperative nausea and vomiting)    Sleep disorder    had sleep study, was told that she has restless leg syndrome, states that she has just learned to live with it, no Rx thus far.     Past Surgical History:  Procedure Laterality Date   ABDOMINAL HYSTERECTOMY  09/29/2007   total   ABDOMINAL SURGERY     "tummy tuck "   CESAREAN SECTION  12/28/1981   COLONOSCOPY     EYE SURGERY Bilateral    lasik   glaucoma eye surgery      LAPAROSCOPIC CHOLECYSTECTOMY SINGLE PORT N/A 03/27/2014   Procedure: LAPAROSCOPIC CHOLECYSTECTOMY SINGLE PORT WITH INTRAOPERATIVE CHOLANGIOGRAM ;  Surgeon: Karie Soda, MD;  Location: [facility] OR;  Service: General;  Laterality: N/A;   TOTAL HIP ARTHROPLASTY Right 04/16/2023   Procedure: RIGHT TOTAL HIP ARTHROPLASTY ANTERIOR APPROACH;  Surgeon: Samson Frederic, MD;  Location: [facility] ORS;  Service: Orthopedics;  Laterality: Right;   UPPER GASTROINTESTINAL ENDOSCOPY     VAGINAL DELIVERY     prior to C/Section    Prior to Admission medications   Medication Sig Start Date End Date Taking? Authorizing Provider   acetaminophen (TYLENOL) 500 MG tablet Take 2 tablets (1,000 mg total) by mouth every 8 (eight) hours as needed for mild pain (pain score 1-3), fever or headache (pain.). 04/16/23  Yes Hill, Alain Honey, PA-C  benzonatate (TESSALON) 100 MG capsule Take 100 mg by mouth 3 (three) times daily as needed for cough.   Yes [provider]  Biotin 1000 MCG tablet Take 1,000 mcg by mouth daily.   Yes [provider]  buPROPion (WELLBUTRIN XL) 300 MG 24 hr tablet Take 300 mg by mouth in the morning.   Yes [provider]  hyoscyamine (LEVSIN SL) 0.125 MG SL tablet Place 1 tablet (0.125 mg total) under the tongue every 6 (six) hours as needed for cramping (nausea, diarrhea). 07/13/23  Yes Quentin Mulling R, PA-C  lisinopril (ZESTRIL) 10 MG tablet Take 10 mg by mouth in the morning.   Yes [provider]  Melatonin 10 MG TABS Take 10 mg by mouth at bedtime.   Yes [provider]  omeprazole (PRILOSEC) 40 MG capsule Take 1 capsule (40 mg total) by mouth 2 (two) times daily before a meal. 07/13/23  Yes Quentin Mulling R, PA-C  PRISTIQ 100 MG 24 hr tablet Take 100 mg by mouth in the morning. 03/05/11  Yes [provider]  rOPINIRole (REQUIP) 1 MG tablet Take  1 mg by mouth in the morning and at bedtime.   Yes [provider]  rosuvastatin (CRESTOR) 10 MG tablet Take 10 mg by mouth in the morning.   Yes [provider]  sucralfate (CARAFATE) 1 g tablet Take 1 g by mouth 4 (four) times daily.   Yes [provider]  methocarbamol (ROBAXIN) 500 MG tablet Take 1 tablet (500 mg total) by mouth every 6 (six) hours as needed. 04/16/23   Clois Dupes, PA-C  naproxen sodium (ALEVE) 220 MG tablet Take 220-440 mg by mouth 2 (two) times daily as needed (pain.).    [provider]  ondansetron (ZOFRAN) 4 MG tablet Take 1 tablet (4 mg total) by mouth every 8 (eight) hours as needed for nausea or vomiting. 04/16/23 04/15/24  Clois Dupes, PA-C     Current Outpatient Medications  Medication Sig Dispense Refill   acetaminophen (TYLENOL) 500 MG tablet Take 2 tablets (1,000 mg total) by mouth every 8 (eight) hours as needed for mild pain (pain score 1-3), fever or headache (pain.). 30 tablet 0   benzonatate (TESSALON) 100 MG capsule Take 100 mg by mouth 3 (three) times daily as needed for cough.     Biotin 1000 MCG tablet Take 1,000 mcg by mouth daily.     buPROPion (WELLBUTRIN XL) 300 MG 24 hr tablet Take 300 mg by mouth in the morning.     hyoscyamine (LEVSIN SL) 0.125 MG SL tablet Place 1 tablet (0.125 mg total) under the tongue every 6 (six) hours as needed for cramping (nausea, diarrhea). 50 tablet 1   lisinopril (ZESTRIL) 10 MG tablet Take 10 mg by mouth in the morning.     Melatonin 10 MG TABS Take 10 mg by mouth at bedtime.     omeprazole (PRILOSEC) 40 MG capsule Take 1 capsule (40 mg total) by mouth 2 (two) times daily before a meal. 60 capsule 0   PRISTIQ 100 MG 24 hr tablet Take 100 mg by mouth in the morning.     rOPINIRole (REQUIP) 1 MG tablet Take 1 mg by mouth in the morning and at bedtime.     rosuvastatin (CRESTOR) 10 MG tablet Take 10 mg by mouth in the morning.     sucralfate (CARAFATE) 1 g tablet Take 1 g by mouth 4 (four) times daily.     methocarbamol (ROBAXIN) 500 MG tablet Take 1 tablet (500 mg total) by mouth every 6 (six) hours as needed. 20 tablet 0   naproxen sodium (ALEVE) 220 MG tablet Take 220-440 mg by mouth 2 (two) times daily as needed (pain.).     ondansetron (ZOFRAN) 4 MG tablet Take 1 tablet (4 mg total) by mouth every 8 (eight) hours as needed for nausea or vomiting. 30 tablet 0   Current Facility-Administered Medications  Medication Dose Route Frequency Provider Last Rate Last Admin   0.9 %  sodium chloride infusion  500 mL Intravenous Once Napoleon Form, MD        Allergies as of 07/24/2023 - Review Complete 07/24/2023  Allergen Reaction Noted   Penicillins Swelling    Compazine  Diarrhea 04/10/2011   Prochlorperazine edisylate Diarrhea    Venlafaxine Itching 06/05/2019    Family History  Problem Relation Age of Onset   Cancer Father        brown lung   Liver disease Neg Hx    Esophageal cancer Neg Hx    Colon cancer Neg Hx    Rectal cancer Neg  Hx    Prostate cancer Neg Hx     Social History   Socioeconomic History   Marital status: Married    Spouse name: Not on file   Number of children: 2   Years of education: Not on file   Highest education level: Not on file  Occupational History   Not on file  Tobacco Use   Smoking status: Never   Smokeless tobacco: Never  Vaping Use   Vaping status: Never Used  Substance and Sexual Activity   Alcohol use: No   Drug use: No   Sexual activity: Yes    Birth control/protection: Surgical  Other Topics Concern   Not on file  Social History Narrative   Not on file   Social Drivers of Health   Financial Resource Strain: Not on file  Food Insecurity: Low Risk  (12/21/2022)   [facility]   Hunger Vital Sign    Worried About Running Out of Food in the Last Year: Never true    Ran Out of Food in the Last Year: Never true  Transportation Needs: No Transportation Needs (12/21/2022)   [facility]   Transportation    In the past 12 months, has lack of reliable transportation kept you from medical appointments, meetings, work or from getting things needed for daily living? : No  Physical Activity: Not on file  Stress: Not on file  Social Connections: Not on file  Intimate Partner Violence: Not on file    Review of Systems:  All other review of systems negative except as mentioned in the HPI.  Physical Exam: Vital signs in last 24 hours: BP (!) 144/84   Pulse 86   Temp 97.9 F (36.6 C) (Skin)   Resp 14   Ht 5\' 2"  (1.575 m)   Wt 154 lb (69.9 kg)   SpO2 93%   BMI 28.17 kg/m  General:   Alert, NAD Lungs:  Clear .   Heart:  Regular rate  and rhythm Abdomen:  Soft, nontender and nondistended. Neuro/Psych:  Alert and cooperative. Normal mood and affect. A and O x 3  Reviewed labs, radiology imaging, old records and pertinent past GI work up  Patient is appropriate for planned procedure(s) and anesthesia in an ambulatory setting   K. Scherry Ran , MD [phone]

## 2023-07-24 NOTE — Patient Instructions (Signed)
DISCHARGE INSTRUCTIONS GIVEN. Handouts on Hiatal Hernia and Gastritis. Office will call to schedule Esophageal Manometry. Resume previous medications. YOU HAD AN ENDOSCOPIC PROCEDURE TODAY AT THE [facility]:   Refer to the procedure report that was given to you for any specific questions about what was found during the examination.  If the procedure report does not answer your questions, please call your gastroenterologist to clarify.  If you requested that your care partner not be given the details of your procedure findings, then the procedure report has been included in a sealed envelope for you to review at your convenience later.  YOU SHOULD EXPECT: Some feelings of bloating in the abdomen. Passage of more gas than usual.  Walking can help get rid of the air that was put into your GI tract during the procedure and reduce the bloating. If you had a lower endoscopy (such as a colonoscopy or flexible sigmoidoscopy) you may notice spotting of blood in your stool or on the toilet paper. If you underwent a bowel prep for your procedure, you may not have a normal bowel movement for a few days.  Please Note:  You might notice some irritation and congestion in your nose or some drainage.  This is from the oxygen used during your procedure.  There is no need for concern and it should clear up in a day or so.  SYMPTOMS TO REPORT IMMEDIATELY:   Following upper endoscopy (EGD)  Vomiting of blood or coffee ground material  New chest pain or pain under the shoulder blades  Painful or persistently difficult swallowing  New shortness of breath  Fever of 100F or higher  Black, tarry-looking stools  For urgent or emergent issues, a gastroenterologist can be reached at any hour by calling [phone]. Do not use MyChart messaging for urgent concerns.    DIET:  We do recommend a small meal at first, but then you may proceed to your regular diet.  Drink plenty of fluids but you should avoid  alcoholic beverages for 24 hours.  ACTIVITY:  You should plan to take it easy for the rest of today and you should NOT DRIVE or use heavy machinery until tomorrow (because of the sedation medicines used during the test).    FOLLOW UP: Our staff will call the number listed on your records the next business day following your procedure.  We will call around 7:15- 8:00 am to check on you and address any questions or concerns that you may have regarding the information given to you following your procedure. If we do not reach you, we will leave a message.     If any biopsies were taken you will be contacted by phone or by letter within the next 1-3 weeks.  Please call us at [phone] if you have not heard about the biopsies in 3 weeks.    SIGNATURES/CONFIDENTIALITY: You and/or your care partner have signed paperwork which will be entered into your electronic medical record.  These signatures attest to the fact that that the information above on your After Visit Summary has been reviewed and is understood.  Full responsibility of the confidentiality of this discharge information lies with you and/or your care-partner.

## 2023-07-27 ENCOUNTER — Telehealth: Payer: Self-pay

## 2023-07-27 NOTE — Telephone Encounter (Signed)
  Follow up Call-     07/24/2023    7:16 AM  Call back number  Post procedure Call Back phone  # [phone]  Permission to leave phone message Yes     LEFT MESSAGE ON ANSWERING MACHINE.

## 2023-07-28 LAB — SURGICAL PATHOLOGY

## 2023-07-29 ENCOUNTER — Encounter: Payer: Self-pay | Admitting: Gastroenterology

## 2023-07-30 NOTE — Telephone Encounter (Signed)
Inbound call from patient requesting a call to discuss previous notes further. Please advise, thank you.

## 2023-07-31 ENCOUNTER — Other Ambulatory Visit: Payer: Self-pay | Admitting: *Deleted

## 2023-07-31 ENCOUNTER — Telehealth: Payer: Self-pay | Admitting: *Deleted

## 2023-07-31 ENCOUNTER — Encounter: Payer: Self-pay | Admitting: *Deleted

## 2023-07-31 DIAGNOSIS — K2100 Gastro-esophageal reflux disease with esophagitis, without bleeding: Secondary | ICD-10-CM

## 2023-07-31 DIAGNOSIS — K224 Dyskinesia of esophagus: Secondary | ICD-10-CM

## 2023-07-31 NOTE — Telephone Encounter (Signed)
Patient scheduled for esophageal manometry  08/12/2023 at 8:30 am   [facility] had a cancellation   Discussed with patient will send her some information in My Chart about her Esophageal manometry

## 2023-08-04 ENCOUNTER — Other Ambulatory Visit: Payer: Self-pay | Admitting: Physician Assistant

## 2023-08-06 ENCOUNTER — Encounter: Payer: Self-pay | Admitting: Gastroenterology

## 2023-08-12 ENCOUNTER — Encounter (HOSPITAL_COMMUNITY)
Admission: RE | Disposition: A | Discharge status: Home / Self Care | Payer: Self-pay | Source: Home / Self Care | Attending: Gastroenterology

## 2023-08-12 ENCOUNTER — Ambulatory Visit (HOSPITAL_COMMUNITY)
Admission: RE | Admit: 2023-08-12 | Discharge: 2023-08-12 | Disposition: A | Discharge status: Home / Self Care | Payer: 59 | Attending: Gastroenterology | Admitting: Gastroenterology

## 2023-08-12 DIAGNOSIS — K449 Diaphragmatic hernia without obstruction or gangrene: Secondary | ICD-10-CM

## 2023-08-12 DIAGNOSIS — R1310 Dysphagia, unspecified: Secondary | ICD-10-CM

## 2023-08-12 HISTORY — PX: ESOPHAGEAL MANOMETRY: SHX5429

## 2023-08-12 SURGERY — MANOMETRY, ESOPHAGUS
Anesthesia: Monitor Anesthesia Care

## 2023-08-12 MED ORDER — LIDOCAINE VISCOUS HCL 2 % MT SOLN
OROMUCOSAL | Status: AC
  Filled 2023-08-12: qty 15

## 2023-08-12 SURGICAL SUPPLY — 2 items
FACESHIELD LNG OPTICON STERILE (SAFETY) IMPLANT
GLOVE BIO SURGEON STRL SZ8 (GLOVE) ×2 IMPLANT

## 2023-08-12 NOTE — Progress Notes (Signed)
Esophageal Manometry done per protocol. Patient tolerated well without distress or complication.

## 2023-08-14 ENCOUNTER — Encounter (HOSPITAL_COMMUNITY): Payer: Self-pay | Admitting: Gastroenterology

## 2023-08-27 ENCOUNTER — Encounter: Payer: Self-pay | Admitting: Gastroenterology

## 2023-09-09 ENCOUNTER — Telehealth: Payer: Self-pay | Admitting: Gastroenterology

## 2023-09-09 NOTE — Telephone Encounter (Signed)
 Inbound call from patient requesting a call to discuss 2/27 manometry results. States she has not heard anything regarding results. Also stated she did not know what the next steps in her plan of care is depending on results. Please advise, thank you.

## 2023-09-10 NOTE — Telephone Encounter (Signed)
 I do not see the results yet. Were they sent to be scanned already?

## 2023-09-10 NOTE — Telephone Encounter (Signed)
 Patient advised. Twice daily omeprazole has helped. She does continue to have some sternal pain.  Patient asks if she will be considered for hiatal hernia repair? She sees Dr Cliffton Asters.

## 2023-09-10 NOTE — Telephone Encounter (Signed)
 Normal study, no specific abnormality.  Please inform patient results. I will drop the reports for scanning.  Thank you

## 2023-09-11 NOTE — Telephone Encounter (Signed)
 Yes, she does not have any major esophageal abnormality that would be contraindication for hiatal hernia repair surgery

## 2023-09-14 NOTE — Telephone Encounter (Signed)
 Message left for the patient on her voicemail.

## 2023-09-17 ENCOUNTER — Other Ambulatory Visit: Payer: Self-pay

## 2023-09-17 DIAGNOSIS — K449 Diaphragmatic hernia without obstruction or gangrene: Secondary | ICD-10-CM

## 2023-09-17 NOTE — Telephone Encounter (Signed)
 New referral entered for Dr Cliffton Asters.

## 2023-10-09 ENCOUNTER — Ambulatory Visit: Admitting: Thoracic Surgery (Cardiothoracic Vascular Surgery)

## 2023-10-30 ENCOUNTER — Encounter: Payer: Self-pay | Admitting: *Deleted

## 2023-10-30 ENCOUNTER — Other Ambulatory Visit: Payer: Self-pay | Admitting: *Deleted

## 2023-10-30 ENCOUNTER — Ambulatory Visit
Attending: Thoracic Surgery (Cardiothoracic Vascular Surgery) | Admitting: Thoracic Surgery (Cardiothoracic Vascular Surgery)

## 2023-10-30 ENCOUNTER — Encounter: Payer: Self-pay | Admitting: Thoracic Surgery (Cardiothoracic Vascular Surgery)

## 2023-10-30 VITALS — BP 148/82 | HR 96 | Resp 18 | Wt 158.0 lb

## 2023-10-30 DIAGNOSIS — K449 Diaphragmatic hernia without obstruction or gangrene: Secondary | ICD-10-CM

## 2023-10-30 NOTE — Progress Notes (Signed)
 [address]       [place] [address]             [phone]                                                   JAZZALYNN MILER [facility] Medical Record #[id number] Date of Birth: 11-Jan-1961   Referring: Jinny Mounts, PA-C Primary Care: Tura Gaines, MD Primary Cardiologist: None   Chief Complaint:        Chief Complaint  Patient presents with   Hiatal Hernia      New patient consultation, chest CT 8/2      History of Present Illness:    [name] 63 y.o. female referred for surgical evaluation of moderate to large hiatal hernia.  Her symptoms have recently worsened.  She regularly has dysphagia, and wakes up hoarse, and often need to clear her throat.  She also has had to increase her anti-acid medication.    She has undergone hip surgery and is doing well from that.  She continues to have reflux, and dysphagia.                 Past Medical History:  Diagnosis Date   Arthritis      upper back & hips - OA   Depression     GERD (gastroesophageal reflux disease)     Headache(784.0)      has gone off caffeine & done with out headache for a long time ago    Hypertension     Lactose intolerance     Menopause     Renal calculus or stone      passes on her own   Sleep disorder      had sleep study, was told that she has restless leg syndrome, states that she has just learned to live with it, no Rx thus far.               Past Surgical History:  Procedure Laterality Date   ABDOMINAL HYSTERECTOMY   4/09    total   ABDOMINAL SURGERY        "tummy tuck "   CESAREAN SECTION   7/83   EYE SURGERY Bilateral      lasik   LAPAROSCOPIC CHOLECYSTECTOMY SINGLE PORT N/A 03/27/2014    Procedure: LAPAROSCOPIC CHOLECYSTECTOMY SINGLE PORT WITH INTRAOPERATIVE CHOLANGIOGRAM ;  Surgeon: Candyce Champagne, MD;  Location: [facility] OR;  Service: General;  Laterality: N/A;   VAGINAL DELIVERY        prior to C/Section               Family History   Problem Relation Age of Onset   Cancer Father          brown lung            Tobacco Use History  Social History       Tobacco Use  Smoking Status Never  Smokeless Tobacco Never      Social History       Substance and Sexual Activity  Alcohol  Use No        Allergies       Allergies  Allergen Reactions   Penicillins Swelling      tongue   Compazine     Prochlorperazine Edisylate  Diarrhea              Current Outpatient Medications  Medication Sig Dispense Refill   acetaminophen  (TYLENOL ) 500 MG tablet Take 1,000 mg by mouth every 6 (six) hours as needed.       benzonatate (TESSALON) 100 MG capsule Take 100 mg by mouth 3 (three) times daily as needed for cough.       lisinopril-hydrochlorothiazide (PRINZIDE,ZESTORETIC) 10-12.5 MG per tablet Take 1 tablet by mouth daily with breakfast.        Multiple Vitamin (MULTIVITAMIN) tablet Take 1 tablet by mouth daily.         omeprazole  (PRILOSEC) 40 MG capsule Take 40 mg by mouth daily.       PRISTIQ 100 MG 24 hr tablet Take 100 mg by mouth daily with breakfast.        rOPINIRole (REQUIP) 1 MG tablet Take 1 mg by mouth in the morning and at bedtime.       rosuvastatin (CRESTOR) 10 MG tablet Take 10 mg by mouth daily.       sucralfate (CARAFATE) 1 g tablet Take 1 g by mouth 4 (four) times daily.          No current facility-administered medications for this visit.        Review of Systems  Constitutional: Negative.   Cardiovascular:  Negative for chest pain.  Gastrointestinal:  Positive for abdominal pain and heartburn.  Neurological: Negative.         PHYSICAL EXAMINATION: Vitals:   10/30/23 0915  BP: (!) 148/82  Pulse: 96  Resp: 18  SpO2: 94%    Physical Exam Constitutional:      Appearance: She is not ill-appearing.  Cardiovascular:     Rate and Rhythm: Normal rate.  Pulmonary:     Effort: Pulmonary effort is normal. No respiratory distress.  Musculoskeletal:        General: Normal range of  motion.  Skin:    General: Skin is warm and dry.  Neurological:     General: No focal deficit present.     Mental Status: She is alert and oriented to person, place, and time.        Diagnostic Studies & Laboratory data:    CT Scan: small sliding hiatal hernia         I have independently reviewed the above radiology studies  and reviewed the findings with the patient.    Recent Lab Findings: Recent Labs       Lab Results  Component Value Date    WBC 8.8 01/02/2020    HGB 14.9 01/02/2020    HCT 44.0 01/02/2020    PLT 289 01/02/2020    GLUCOSE 161 (H) 01/02/2020    CHOL 249 (H) 04/10/2011    TRIG 309 (H) 04/10/2011    HDL 50 04/10/2011    LDLCALC 137 (H) 04/10/2011    ALT 21 01/02/2020    AST 30 01/02/2020    NA 137 01/02/2020    K 3.3 (L) 01/02/2020    CL 100 01/02/2020    CREATININE 0.87 01/02/2020    BUN 16 01/02/2020    CO2 22 01/02/2020    TSH 1.681 04/10/2011    INR 1.0 10/25/2007              Assessment / Plan:   63yo female with a hiatal hernia.  I personally reviewed her imaging from [facility], and she does have a small hiatal hernia.  Her most recent EGD  shows that she has a 6cm sliding hiatal hernia.  Given her symptoms, I think that a hiatal hernia repair with fundoplication is reasonable.  She is agreeable to proceed.      I  spent 40 minutes with the patient face to face counseling and coordination of care.     Sherrill Mckamie Ala Alice

## 2023-10-30 NOTE — H&P (View-Only) (Signed)
 [address]       [place] [address]             [phone]                                                   [name] [facility] Medical Record #[id number] Date of Birth: 11-Jan-1961   Referring: Jinny Mounts, PA-C Primary Care: Tura Gaines, MD Primary Cardiologist: None   Chief Complaint:        Chief Complaint  Patient presents with   Hiatal Hernia      New patient consultation, chest CT 8/2      History of Present Illness:    [name] 63 y.o. female referred for surgical evaluation of moderate to large hiatal hernia.  Her symptoms have recently worsened.  She regularly has dysphagia, and wakes up hoarse, and often need to clear her throat.  She also has had to increase her anti-acid medication.    She has undergone hip surgery and is doing well from that.  She continues to have reflux, and dysphagia.                 Past Medical History:  Diagnosis Date   Arthritis      upper back & hips - OA   Depression     GERD (gastroesophageal reflux disease)     Headache(784.0)      has gone off caffeine & done with out headache for a long time ago    Hypertension     Lactose intolerance     Menopause     Renal calculus or stone      passes on her own   Sleep disorder      had sleep study, was told that she has restless leg syndrome, states that she has just learned to live with it, no Rx thus far.               Past Surgical History:  Procedure Laterality Date   ABDOMINAL HYSTERECTOMY   4/09    total   ABDOMINAL SURGERY        "tummy tuck "   CESAREAN SECTION   7/83   EYE SURGERY Bilateral      lasik   LAPAROSCOPIC CHOLECYSTECTOMY SINGLE PORT N/A 03/27/2014    Procedure: LAPAROSCOPIC CHOLECYSTECTOMY SINGLE PORT WITH INTRAOPERATIVE CHOLANGIOGRAM ;  Surgeon: Candyce Champagne, MD;  Location: [facility] OR;  Service: General;  Laterality: N/A;   VAGINAL DELIVERY        prior to C/Section               Family History   Problem Relation Age of Onset   Cancer Father          brown lung            Tobacco Use History  Social History       Tobacco Use  Smoking Status Never  Smokeless Tobacco Never      Social History       Substance and Sexual Activity  Alcohol  Use No        Allergies       Allergies  Allergen Reactions   Penicillins Swelling      tongue   Compazine     Prochlorperazine Edisylate  Diarrhea              Current Outpatient Medications  Medication Sig Dispense Refill   acetaminophen  (TYLENOL ) 500 MG tablet Take 1,000 mg by mouth every 6 (six) hours as needed.       benzonatate (TESSALON) 100 MG capsule Take 100 mg by mouth 3 (three) times daily as needed for cough.       lisinopril-hydrochlorothiazide (PRINZIDE,ZESTORETIC) 10-12.5 MG per tablet Take 1 tablet by mouth daily with breakfast.        Multiple Vitamin (MULTIVITAMIN) tablet Take 1 tablet by mouth daily.         omeprazole  (PRILOSEC) 40 MG capsule Take 40 mg by mouth daily.       PRISTIQ 100 MG 24 hr tablet Take 100 mg by mouth daily with breakfast.        rOPINIRole (REQUIP) 1 MG tablet Take 1 mg by mouth in the morning and at bedtime.       rosuvastatin (CRESTOR) 10 MG tablet Take 10 mg by mouth daily.       sucralfate (CARAFATE) 1 g tablet Take 1 g by mouth 4 (four) times daily.          No current facility-administered medications for this visit.        Review of Systems  Constitutional: Negative.   Cardiovascular:  Negative for chest pain.  Gastrointestinal:  Positive for abdominal pain and heartburn.  Neurological: Negative.         PHYSICAL EXAMINATION: Vitals:   10/30/23 0915  BP: (!) 148/82  Pulse: 96  Resp: 18  SpO2: 94%    Physical Exam Constitutional:      Appearance: She is not ill-appearing.  Cardiovascular:     Rate and Rhythm: Normal rate.  Pulmonary:     Effort: Pulmonary effort is normal. No respiratory distress.  Musculoskeletal:        General: Normal range of  motion.  Skin:    General: Skin is warm and dry.  Neurological:     General: No focal deficit present.     Mental Status: She is alert and oriented to person, place, and time.        Diagnostic Studies & Laboratory data:    CT Scan: small sliding hiatal hernia         I have independently reviewed the above radiology studies  and reviewed the findings with the patient.    Recent Lab Findings: Recent Labs       Lab Results  Component Value Date    WBC 8.8 01/02/2020    HGB 14.9 01/02/2020    HCT 44.0 01/02/2020    PLT 289 01/02/2020    GLUCOSE 161 (H) 01/02/2020    CHOL 249 (H) 04/10/2011    TRIG 309 (H) 04/10/2011    HDL 50 04/10/2011    LDLCALC 137 (H) 04/10/2011    ALT 21 01/02/2020    AST 30 01/02/2020    NA 137 01/02/2020    K 3.3 (L) 01/02/2020    CL 100 01/02/2020    CREATININE 0.87 01/02/2020    BUN 16 01/02/2020    CO2 22 01/02/2020    TSH 1.681 04/10/2011    INR 1.0 10/25/2007              Assessment / Plan:   63yo female with a hiatal hernia.  I personally reviewed her imaging from [facility], and she does have a small hiatal hernia.  Her most recent EGD  shows that she has a 6cm sliding hiatal hernia.  Given her symptoms, I think that a hiatal hernia repair with fundoplication is reasonable.  She is agreeable to proceed.      I  spent 40 minutes with the patient face to face counseling and coordination of care.     [name]

## 2023-11-04 ENCOUNTER — Encounter (HOSPITAL_COMMUNITY): Payer: Self-pay

## 2023-11-04 NOTE — Pre-Procedure Instructions (Signed)
 Surgical Instructions   Your procedure is scheduled on Nov 09, 2023. Report to [facility] Main Entrance "A" at 9:00 A.M., then check in with the Admitting office. Any questions or running late day of surgery: call [phone]  Questions prior to your surgery date: call [phone], Monday-Friday, 8am-4pm. If you experience any cold or flu symptoms such as cough, fever, chills, shortness of breath, etc. between now and your scheduled surgery, please notify us  at the above number.     Remember:  Do not eat or drink after midnight the night before your surgery    Take these medicines the morning of surgery with A SIP OF WATER : omeprazole  (PRILOSEC)  PRISTIQ  rOPINIRole (REQUIP)  rosuvastatin (CRESTOR)    May take these medicines IF NEEDED: acetaminophen  (TYLENOL )  benzonatate (TESSALON)    One week prior to surgery, STOP taking any Aspirin  (unless otherwise instructed by your surgeon) Aleve, Naproxen, Ibuprofen, Motrin, Advil, Goody's, BC's, all herbal medications, fish oil, and non-prescription vitamins. This includes your medication: meloxicam (MOBIC)                      Do NOT Smoke (Tobacco/Vaping) for 24 hours prior to your procedure.  If you use a CPAP at night, you may bring your mask/headgear for your overnight stay.   You will be asked to remove any contacts, glasses, piercing's, hearing aid's, dentures/partials prior to surgery. Please bring cases for these items if needed.    Patients discharged the day of surgery will not be allowed to drive home, and someone needs to stay with them for 24 hours.  SURGICAL WAITING ROOM VISITATION Patients may have no more than 2 support people in the waiting area - these visitors may rotate.   Pre-op nurse will coordinate an appropriate time for 1 ADULT support person, who may not rotate, to accompany patient in pre-op.  Children under the age of 39 must have an adult with them who is not the patient and must remain in the main  waiting area with an adult.  If the patient needs to stay at the hospital during part of their recovery, the visitor guidelines for inpatient rooms apply.  Please refer to the [facility] website for the visitor guidelines for any additional information.   If you received a COVID test during your pre-op visit  it is requested that you wear a mask when out in public, stay away from anyone that may not be feeling well and notify your surgeon if you develop symptoms. If you have been in contact with anyone that has tested positive in the last 10 days please notify you surgeon.      Pre-operative CHG Bathing Instructions   You can play a key role in reducing the risk of infection after surgery. Your skin needs to be as free of germs as possible. You can reduce the number of germs on your skin by washing with CHG (chlorhexidine  gluconate) soap before surgery. CHG is an antiseptic soap that kills germs and continues to kill germs even after washing.   DO NOT use if you have an allergy to chlorhexidine /CHG or antibacterial soaps. If your skin becomes reddened or irritated, stop using the CHG and notify one of our RNs at [phone].              TAKE A SHOWER THE NIGHT BEFORE SURGERY AND THE DAY OF SURGERY    Please keep in mind the following:  DO NOT shave, including legs  and underarms, 48 hours prior to surgery.   You may shave your face before/day of surgery.  Place clean sheets on your bed the night before surgery Use a clean washcloth (not used since being washed) for each shower. DO NOT sleep with pet's night before surgery.  CHG Shower Instructions:  Wash your face and private area with normal soap. If you choose to wash your hair, wash first with your normal shampoo.  After you use shampoo/soap, rinse your hair and body thoroughly to remove shampoo/soap residue.  Turn the water  OFF and apply half the bottle of CHG soap to a CLEAN washcloth.  Apply CHG soap ONLY FROM YOUR NECK DOWN TO  YOUR TOES (washing for 3-5 minutes)  DO NOT use CHG soap on face, private areas, open wounds, or sores.  Pay special attention to the area where your surgery is being performed.  If you are having back surgery, having someone wash your back for you may be helpful. Wait 2 minutes after CHG soap is applied, then you may rinse off the CHG soap.  Pat dry with a clean towel  Put on clean pajamas    Additional instructions for the day of surgery: DO NOT APPLY any lotions, deodorants, cologne, or perfumes.   Do not wear jewelry or makeup Do not wear nail polish, gel polish, artificial nails, or any other type of covering on natural nails (fingers and toes) Do not bring valuables to the hospital. [facility] is not responsible for valuables/personal belongings. Put on clean/comfortable clothes.  Please brush your teeth.  Ask your nurse before applying any prescription medications to the skin.

## 2023-11-05 ENCOUNTER — Encounter (HOSPITAL_COMMUNITY)
Admission: RE | Admit: 2023-11-05 | Discharge: 2023-11-05 | Disposition: A | Discharge status: Home / Self Care | Source: Ambulatory Visit | Attending: Thoracic Surgery (Cardiothoracic Vascular Surgery) | Admitting: Thoracic Surgery (Cardiothoracic Vascular Surgery)

## 2023-11-05 ENCOUNTER — Encounter (HOSPITAL_COMMUNITY): Payer: Self-pay

## 2023-11-05 ENCOUNTER — Other Ambulatory Visit: Payer: Self-pay

## 2023-11-05 VITALS — BP 152/92 | HR 90 | Temp 98.4°F | Resp 18 | Ht 62.0 in | Wt 156.5 lb

## 2023-11-05 DIAGNOSIS — K449 Diaphragmatic hernia without obstruction or gangrene: Secondary | ICD-10-CM | POA: Diagnosis not present

## 2023-11-05 DIAGNOSIS — Z01818 Encounter for other preprocedural examination: Secondary | ICD-10-CM

## 2023-11-05 HISTORY — DX: Headache, unspecified: R51.9

## 2023-11-05 HISTORY — DX: Personal history of other diseases of the digestive system: Z87.19

## 2023-11-05 LAB — COMPREHENSIVE METABOLIC PANEL WITH GFR
ALT: 16 U/L (ref 0–44)
AST: 22 U/L (ref 15–41)
Albumin: 3.6 g/dL (ref 3.5–5.0)
Alkaline Phosphatase: 72 U/L (ref 38–126)
Anion gap: 7 (ref 5–15)
BUN: 14 mg/dL (ref 8–23)
CO2: 27 mmol/L (ref 22–32)
Calcium: 9.1 mg/dL (ref 8.9–10.3)
Chloride: 106 mmol/L (ref 98–111)
Creatinine, Ser: 0.65 mg/dL (ref 0.44–1.00)
GFR, Estimated: >60 mL/min (ref >60)
Glucose, Bld: 81 mg/dL (ref 70–99)
Potassium: 3.7 mmol/L (ref 3.5–5.1)
Sodium: 140 mmol/L (ref 135–145)
Total Bilirubin: 0.5 mg/dL (ref 0.0–1.2)
Total Protein: 6.7 g/dL (ref 6.5–8.1)

## 2023-11-05 LAB — CBC
HCT: 39.8 % (ref 36.0–46.0)
Hemoglobin: 12.9 g/dL (ref 12.0–15.0)
MCH: 27.4 pg (ref 26.0–34.0)
MCHC: 32.4 g/dL (ref 30.0–36.0)
MCV: 84.5 fL (ref 80.0–100.0)
Platelets: 251 10*3/uL (ref 150–400)
RBC: 4.71 MIL/uL (ref 3.87–5.11)
RDW: 12.8 % (ref 11.5–15.5)
WBC: 6.5 10*3/uL (ref 4.0–10.5)
nRBC: 0 % (ref 0.0–0.2)

## 2023-11-05 LAB — URINALYSIS, ROUTINE W REFLEX MICROSCOPIC
Bacteria, UA: NONE SEEN
Bilirubin Urine: NEGATIVE
Glucose, UA: NEGATIVE mg/dL
Hgb urine dipstick: NEGATIVE
Ketones, ur: NEGATIVE mg/dL
Nitrite: NEGATIVE
Protein, ur: NEGATIVE mg/dL
Specific Gravity, Urine: 1.025 (ref 1.005–1.030)
pH: 5 (ref 5.0–8.0)

## 2023-11-05 LAB — TYPE AND SCREEN
ABO/RH(D): O POS
Antibody Screen: NEGATIVE

## 2023-11-05 LAB — PROTIME-INR
INR: 1.1 (ref 0.8–1.2)
Prothrombin Time: 13.9 s (ref 11.4–15.2)

## 2023-11-05 LAB — APTT: aPTT: 28 s (ref 24–36)

## 2023-11-05 LAB — SURGICAL PCR SCREEN
MRSA, PCR: POSITIVE — AB
Staphylococcus aureus: POSITIVE — AB

## 2023-11-05 NOTE — Progress Notes (Signed)
 PCP - Shawn Lazoff, DO Cardiologist - Dr. Enrico Hartshorn - 02/23/2023 for CP. Echo completed and results normal. Pt unable to complete stress test ordered due to hip pain. CP d/t be related to current hiatal hernia. Follow-up as needed.  PPM/ICD - Denies Device Orders - n/a Rep Notified - n/a  Chest x-ray - Will be completed DOS EKG - 11/05/2023 Stress Test - Denies ECHO - 04/09/2023 (CE) Cardiac Cath - Denies  Sleep Study - Denies CPAP - n/a  No DM  Last dose of GLP1 agonist- n/a GLP1 instructions: n/a  Blood Thinner Instructions: n/a Aspirin  Instructions: n/a  NPO after midnight  COVID TEST- n/a   Anesthesia review: Yes. Borderline EKG review. Recent cardiac testing completed. Pt also had right hip replacement at [facility] 04/16/23 with no complications.  Patient denies shortness of breath, fever, cough and chest pain at PAT appointment. Pt denies any respiratory illness/infection in the last two months.   All instructions explained to the patient, with a verbal understanding of the material. Patient agrees to go over the instructions while at home for a better understanding. Patient also instructed to self quarantine after being tested for COVID-19. The opportunity to ask questions was provided.

## 2023-11-05 NOTE — Progress Notes (Signed)
 Dr. Deloise Ferries made aware of surgical PCR result +MSSA and +MRSA. MD also made aware of trace leukocytes in UA

## 2023-11-06 NOTE — Progress Notes (Signed)
 Patient informed on surgical arrival time change. Patient to arrive at 1200 on 11/09/2023. Patient verbalized understanding.

## 2023-11-08 NOTE — Progress Notes (Signed)
 Patient informed on new arrival time change. Patient to arrive at 0900 on 11/09/2023. Patient verbalized understanding.

## 2023-11-09 ENCOUNTER — Ambulatory Visit (HOSPITAL_COMMUNITY)
Admission: RE | Admit: 2023-11-09 | Discharge: 2023-11-10 | Disposition: A | Discharge status: Home / Self Care | Attending: Thoracic Surgery (Cardiothoracic Vascular Surgery) | Admitting: Thoracic Surgery (Cardiothoracic Vascular Surgery)

## 2023-11-09 ENCOUNTER — Other Ambulatory Visit: Payer: Self-pay

## 2023-11-09 ENCOUNTER — Ambulatory Visit (HOSPITAL_COMMUNITY): Admitting: Physician Assistant

## 2023-11-09 ENCOUNTER — Encounter (HOSPITAL_BASED_OUTPATIENT_CLINIC_OR_DEPARTMENT_OTHER)
Admission: RE | Disposition: A | Discharge status: Home / Self Care | Payer: Self-pay | Source: Home / Self Care | Attending: Thoracic Surgery (Cardiothoracic Vascular Surgery)

## 2023-11-09 ENCOUNTER — Ambulatory Visit (HOSPITAL_COMMUNITY)

## 2023-11-09 ENCOUNTER — Ambulatory Visit (HOSPITAL_BASED_OUTPATIENT_CLINIC_OR_DEPARTMENT_OTHER): Admitting: Anesthesiology

## 2023-11-09 ENCOUNTER — Encounter (HOSPITAL_COMMUNITY): Payer: Self-pay | Admitting: Thoracic Surgery (Cardiothoracic Vascular Surgery)

## 2023-11-09 DIAGNOSIS — R1310 Dysphagia, unspecified: Secondary | ICD-10-CM | POA: Insufficient documentation

## 2023-11-09 DIAGNOSIS — K449 Diaphragmatic hernia without obstruction or gangrene: Secondary | ICD-10-CM

## 2023-11-09 DIAGNOSIS — K219 Gastro-esophageal reflux disease without esophagitis: Secondary | ICD-10-CM | POA: Diagnosis not present

## 2023-11-09 DIAGNOSIS — I10 Essential (primary) hypertension: Secondary | ICD-10-CM | POA: Insufficient documentation

## 2023-11-09 DIAGNOSIS — Z8719 Personal history of other diseases of the digestive system: Principal | ICD-10-CM

## 2023-11-09 DIAGNOSIS — Z9889 Other specified postprocedural states: Secondary | ICD-10-CM | POA: Diagnosis present

## 2023-11-09 HISTORY — PX: XI ROBOTIC ASSISTED HIATAL HERNIA REPAIR: SHX6889

## 2023-11-09 HISTORY — PX: ESOPHAGOGASTRODUODENOSCOPY: SHX5428

## 2023-11-09 SURGERY — REPAIR, HERNIA, HIATAL, ROBOT-ASSISTED
Anesthesia: General | Site: Chest

## 2023-11-09 MED ORDER — LIDOCAINE 2% (20 MG/ML) 5 ML SYRINGE
INTRAMUSCULAR | Status: AC
  Filled 2023-11-09: qty 5

## 2023-11-09 MED ORDER — HYDRALAZINE HCL 20 MG/ML IJ SOLN: 10.0000 mg | Freq: Four times a day (QID) | INTRAMUSCULAR | Status: DC | PRN

## 2023-11-09 MED ORDER — PROPOFOL 10 MG/ML IV BOLUS
INTRAVENOUS | Status: AC
  Filled 2023-11-09: qty 20

## 2023-11-09 MED ORDER — PANTOPRAZOLE SODIUM 40 MG IV SOLR
40.0000 mg | INTRAVENOUS | Status: DC
  Administered 2023-11-09: 40 mg via INTRAVENOUS
  Filled 2023-11-09: qty 10

## 2023-11-09 MED ORDER — OXYCODONE HCL 5 MG PO TABS: 5.0000 mg | ORAL_TABLET | Freq: Once | ORAL | Status: DC | PRN

## 2023-11-09 MED ORDER — 0.9 % SODIUM CHLORIDE (POUR BTL) OPTIME
TOPICAL | Status: DC | PRN
  Administered 2023-11-09: 1000 mL

## 2023-11-09 MED ORDER — MELATONIN 5 MG PO TABS: 10.0000 mg | ORAL_TABLET | Freq: Every day | ORAL | Status: DC

## 2023-11-09 MED ORDER — FENTANYL CITRATE (PF) 250 MCG/5ML IJ SOLN
INTRAMUSCULAR | Status: AC
  Filled 2023-11-09: qty 5

## 2023-11-09 MED ORDER — ONDANSETRON HCL 4 MG/2ML IJ SOLN
INTRAMUSCULAR | Status: AC
  Filled 2023-11-09: qty 2

## 2023-11-09 MED ORDER — MIDAZOLAM HCL 2 MG/2ML IJ SOLN
INTRAMUSCULAR | Status: DC | PRN
  Administered 2023-11-09: 2 mg via INTRAVENOUS

## 2023-11-09 MED ORDER — ONDANSETRON HCL 4 MG/2ML IJ SOLN
INTRAMUSCULAR | Status: DC | PRN
  Administered 2023-11-09: 4 mg via INTRAVENOUS

## 2023-11-09 MED ORDER — ONDANSETRON HCL 4 MG/2ML IJ SOLN: 4.0000 mg | Freq: Four times a day (QID) | INTRAMUSCULAR | Status: DC | PRN

## 2023-11-09 MED ORDER — LISINOPRIL 20 MG PO TABS: 20.0000 mg | ORAL_TABLET | Freq: Every morning | ORAL | Status: DC

## 2023-11-09 MED ORDER — SCOPOLAMINE 1 MG/3DAYS TD PT72
MEDICATED_PATCH | TRANSDERMAL | Status: AC
  Filled 2023-11-09: qty 1

## 2023-11-09 MED ORDER — BUPIVACAINE LIPOSOME 1.3 % IJ SUSP
INTRAMUSCULAR | Status: DC | PRN
  Administered 2023-11-09: 50 mL

## 2023-11-09 MED ORDER — ROCURONIUM BROMIDE 10 MG/ML (PF) SYRINGE
PREFILLED_SYRINGE | INTRAVENOUS | Status: AC
  Filled 2023-11-09: qty 10

## 2023-11-09 MED ORDER — ACETAMINOPHEN 500 MG PO TABS
1000.0000 mg | ORAL_TABLET | Freq: Four times a day (QID) | ORAL | Status: DC
  Administered 2023-11-10: 1000 mg via ORAL
  Filled 2023-11-09: qty 2

## 2023-11-09 MED ORDER — PHENYLEPHRINE 80 MCG/ML (10ML) SYRINGE FOR IV PUSH (FOR BLOOD PRESSURE SUPPORT)
PREFILLED_SYRINGE | INTRAVENOUS | Status: DC | PRN
  Administered 2023-11-09 (×3): 80 ug via INTRAVENOUS

## 2023-11-09 MED ORDER — SENNOSIDES-DOCUSATE SODIUM 8.6-50 MG PO TABS: 1.0000 | ORAL_TABLET | Freq: Every day | ORAL | Status: DC

## 2023-11-09 MED ORDER — DEXAMETHASONE SODIUM PHOSPHATE 10 MG/ML IJ SOLN
INTRAMUSCULAR | Status: AC
  Filled 2023-11-09: qty 1

## 2023-11-09 MED ORDER — MORPHINE SULFATE (PF) 2 MG/ML IV SOLN
INTRAVENOUS | Status: AC
  Filled 2023-11-09: qty 1

## 2023-11-09 MED ORDER — SUGAMMADEX SODIUM 200 MG/2ML IV SOLN
INTRAVENOUS | Status: DC | PRN
  Administered 2023-11-09: 200 mg via INTRAVENOUS

## 2023-11-09 MED ORDER — BUPIVACAINE HCL (PF) 0.5 % IJ SOLN
INTRAMUSCULAR | Status: AC
  Filled 2023-11-09: qty 30

## 2023-11-09 MED ORDER — BISACODYL 5 MG PO TBEC: 10.0000 mg | DELAYED_RELEASE_TABLET | Freq: Every day | ORAL | Status: DC

## 2023-11-09 MED ORDER — OXYCODONE HCL 5 MG/5ML PO SOLN: 5.0000 mg | Freq: Once | ORAL | Status: DC | PRN

## 2023-11-09 MED ORDER — ACETAMINOPHEN 160 MG/5ML PO SOLN: 1000.0000 mg | Freq: Four times a day (QID) | ORAL | Status: DC

## 2023-11-09 MED ORDER — ROCURONIUM BROMIDE 10 MG/ML (PF) SYRINGE
PREFILLED_SYRINGE | INTRAVENOUS | Status: DC | PRN
  Administered 2023-11-09 (×2): 30 mg via INTRAVENOUS
  Administered 2023-11-09: 50 mg via INTRAVENOUS
  Administered 2023-11-09: 20 mg via INTRAVENOUS

## 2023-11-09 MED ORDER — PROPOFOL 10 MG/ML IV BOLUS
INTRAVENOUS | Status: DC | PRN
  Administered 2023-11-09: 180 mg via INTRAVENOUS

## 2023-11-09 MED ORDER — ORAL CARE MOUTH RINSE: 15.0000 mL | Freq: Once | OROMUCOSAL | Status: AC

## 2023-11-09 MED ORDER — FENTANYL CITRATE (PF) 250 MCG/5ML IJ SOLN
INTRAMUSCULAR | Status: DC | PRN
  Administered 2023-11-09: 50 ug via INTRAVENOUS
  Administered 2023-11-09: 100 ug via INTRAVENOUS
  Administered 2023-11-09 (×4): 50 ug via INTRAVENOUS

## 2023-11-09 MED ORDER — BUPIVACAINE LIPOSOME 1.3 % IJ SUSP
INTRAMUSCULAR | Status: AC
Start: 2023-11-09 — End: ?
  Filled 2023-11-09: qty 20

## 2023-11-09 MED ORDER — FENTANYL CITRATE (PF) 100 MCG/2ML IJ SOLN: 25.0000 ug | INTRAMUSCULAR | Status: DC | PRN

## 2023-11-09 MED ORDER — KETOROLAC TROMETHAMINE 15 MG/ML IJ SOLN
15.0000 mg | Freq: Four times a day (QID) | INTRAMUSCULAR | Status: DC | PRN
  Administered 2023-11-09: 15 mg via INTRAVENOUS
  Filled 2023-11-09: qty 1

## 2023-11-09 MED ORDER — ROPINIROLE HCL 1 MG PO TABS
1.0000 mg | ORAL_TABLET | ORAL | Status: DC
  Filled 2023-11-09 (×3): qty 1

## 2023-11-09 MED ORDER — LACTATED RINGERS IV SOLN: INTRAVENOUS | Status: DC

## 2023-11-09 MED ORDER — ROSUVASTATIN CALCIUM 5 MG PO TABS: 10.0000 mg | ORAL_TABLET | Freq: Every morning | ORAL | Status: DC

## 2023-11-09 MED ORDER — DEXAMETHASONE SODIUM PHOSPHATE 10 MG/ML IJ SOLN
INTRAMUSCULAR | Status: DC | PRN
  Administered 2023-11-09: 10 mg via INTRAVENOUS

## 2023-11-09 MED ORDER — OXYCODONE HCL 5 MG PO TABS: 5.0000 mg | ORAL_TABLET | ORAL | Status: DC | PRN

## 2023-11-09 MED ORDER — VANCOMYCIN HCL IN DEXTROSE 1-5 GM/200ML-% IV SOLN
1000.0000 mg | INTRAVENOUS | Status: AC
Start: 2023-11-09 — End: 2023-11-09

## 2023-11-09 MED ORDER — VANCOMYCIN HCL IN DEXTROSE 1-5 GM/200ML-% IV SOLN
INTRAVENOUS | Status: AC
Start: 2023-11-09 — End: 2023-11-09
  Administered 2023-11-09: 1000 mg via INTRAVENOUS
  Filled 2023-11-09: qty 200

## 2023-11-09 MED ORDER — SCOPOLAMINE 1 MG/3DAYS TD PT72
MEDICATED_PATCH | TRANSDERMAL | Status: DC | PRN
  Administered 2023-11-09: 1 via TRANSDERMAL

## 2023-11-09 MED ORDER — VANCOMYCIN HCL IN DEXTROSE 1-5 GM/200ML-% IV SOLN
1000.0000 mg | Freq: Two times a day (BID) | INTRAVENOUS | Status: AC
  Administered 2023-11-09: 1000 mg via INTRAVENOUS
  Filled 2023-11-09: qty 200

## 2023-11-09 MED ORDER — LIDOCAINE 2% (20 MG/ML) 5 ML SYRINGE
INTRAMUSCULAR | Status: DC | PRN
  Administered 2023-11-09: 100 mg via INTRAVENOUS

## 2023-11-09 MED ORDER — CHLORHEXIDINE GLUCONATE 0.12 % MT SOLN: 15.0000 mL | Freq: Once | OROMUCOSAL | Status: AC

## 2023-11-09 MED ORDER — TRAMADOL HCL 50 MG PO TABS: 50.0000 mg | ORAL_TABLET | Freq: Four times a day (QID) | ORAL | Status: DC | PRN

## 2023-11-09 MED ORDER — PHENYLEPHRINE 80 MCG/ML (10ML) SYRINGE FOR IV PUSH (FOR BLOOD PRESSURE SUPPORT)
PREFILLED_SYRINGE | INTRAVENOUS | Status: AC
  Filled 2023-11-09: qty 10

## 2023-11-09 MED ORDER — CHLORHEXIDINE GLUCONATE 0.12 % MT SOLN
OROMUCOSAL | Status: AC
  Administered 2023-11-09: 15 mL via OROMUCOSAL
  Filled 2023-11-09: qty 15

## 2023-11-09 MED ORDER — EPHEDRINE SULFATE-NACL 50-0.9 MG/10ML-% IV SOSY
PREFILLED_SYRINGE | INTRAVENOUS | Status: DC | PRN
  Administered 2023-11-09 (×3): 5 mg via INTRAVENOUS

## 2023-11-09 MED ORDER — EPHEDRINE 5 MG/ML INJ
INTRAVENOUS | Status: AC
  Filled 2023-11-09: qty 5

## 2023-11-09 MED ORDER — MORPHINE SULFATE (PF) 2 MG/ML IV SOLN
2.0000 mg | INTRAVENOUS | Status: DC | PRN
  Administered 2023-11-09 – 2023-11-10 (×3): 2 mg via INTRAVENOUS
  Filled 2023-11-09 (×2): qty 1

## 2023-11-09 MED ORDER — ENOXAPARIN SODIUM 40 MG/0.4ML IJ SOSY
40.0000 mg | PREFILLED_SYRINGE | Freq: Every day | INTRAMUSCULAR | Status: DC
  Administered 2023-11-10: 40 mg via SUBCUTANEOUS
  Filled 2023-11-09: qty 0.4

## 2023-11-09 MED ORDER — MIDAZOLAM HCL 2 MG/2ML IJ SOLN
INTRAMUSCULAR | Status: AC
  Filled 2023-11-09: qty 2

## 2023-11-09 SURGICAL SUPPLY — 62 items
BLADE SURG 11 STRL SS (BLADE) ×2 IMPLANT
BUTTON OLYMPUS DEFENDO 5 PIECE (MISCELLANEOUS) ×2 IMPLANT
CANISTER SUCTION 3000ML PPV (SUCTIONS) ×4 IMPLANT
CATH THORACIC 28FR (CATHETERS) IMPLANT
CNTNR URN SCR LID CUP LEK RST (MISCELLANEOUS) ×2 IMPLANT
DEFOGGER SCOPE WARM SEASHARP (MISCELLANEOUS) ×2 IMPLANT
DERMABOND ADVANCED .7 DNX12 (GAUZE/BANDAGES/DRESSINGS) ×2 IMPLANT
DEVICE SUTURE ENDOST 10MM (ENDOMECHANICALS) IMPLANT
DRAIN PENROSE 12X.25 LTX STRL (MISCELLANEOUS) IMPLANT
DRAPE ARM DVNC X/XI (DISPOSABLE) ×8 IMPLANT
DRAPE COLUMN DVNC XI (DISPOSABLE) ×2 IMPLANT
DRAPE CV SPLIT W-CLR ANES SCRN (DRAPES) ×2 IMPLANT
DRAPE INCISE IOBAN 66X45 STRL (DRAPES) IMPLANT
DRAPE SURG ORHT 6 SPLT 77X108 (DRAPES) ×2 IMPLANT
DRIVER NDL MEGA 8 DVNC XI (INSTRUMENTS) IMPLANT
DRIVER NDL MEGA SUTCUT DVNCXI (INSTRUMENTS) IMPLANT
DRIVER NDLE MEGA DVNC XI (INSTRUMENTS) IMPLANT
DRIVER NDLE MEGA SUTCUT DVNCXI (INSTRUMENTS) IMPLANT
ELECTRODE REM PT RTRN 9FT ADLT (ELECTROSURGICAL) ×2 IMPLANT
FELT TEFLON 1X6 (MISCELLANEOUS) IMPLANT
FORCEPS BPLR LNG DVNC XI (INSTRUMENTS) IMPLANT
FORCEPS CADIERE DVNC XI (FORCEP) IMPLANT
GAUZE SPONGE 4X4 12PLY STRL (GAUZE/BANDAGES/DRESSINGS) ×2 IMPLANT
GLOVE BIO SURGEON STRL SZ7 (GLOVE) ×2 IMPLANT
GLOVE BIO SURGEON STRL SZ7.5 (GLOVE) ×6 IMPLANT
GOWN STRL REUS W/ TWL LRG LVL3 (GOWN DISPOSABLE) ×2 IMPLANT
GOWN STRL REUS W/ TWL XL LVL3 (GOWN DISPOSABLE) ×4 IMPLANT
GOWN STRL REUS W/TWL 2XL LVL3 (GOWN DISPOSABLE) ×2 IMPLANT
GRASPER SUT TROCAR 14GX15 (MISCELLANEOUS) IMPLANT
GRASPER TIP-UP FEN DVNC XI (INSTRUMENTS) IMPLANT
IV NS 1000ML BAXH (IV SOLUTION) IMPLANT
KIT BASIN OR (CUSTOM PROCEDURE TRAY) ×2 IMPLANT
KIT TURNOVER KIT B (KITS) ×2 IMPLANT
MARKER SKIN DUAL TIP RULER LAB (MISCELLANEOUS) ×2 IMPLANT
MESH OVITEX 1S RESORB 10X12 6L (Mesh General) IMPLANT
NS IRRIG 1000ML POUR BTL (IV SOLUTION) ×4 IMPLANT
OBTURATOR OPTICALSTD 8 DVNC (TROCAR) ×2 IMPLANT
OIL SILICONE PENTAX (PARTS (SERVICE/REPAIRS)) IMPLANT
PACK CHEST (CUSTOM PROCEDURE TRAY) ×2 IMPLANT
PAD ARMBOARD POSITIONER FOAM (MISCELLANEOUS) ×4 IMPLANT
SEAL UNIV 5-12 XI (MISCELLANEOUS) ×8 IMPLANT
SEALER LIGASURE MARYLAND 30 (ELECTROSURGICAL) IMPLANT
SEALER SYNCHRO 8 IS4000 DVNC (MISCELLANEOUS) IMPLANT
SOLUTION ANTFG W/FOAM PAD STRL (MISCELLANEOUS) IMPLANT
SUT ETHIBOND 0 36 GRN (SUTURE) ×4 IMPLANT
SUT SILK 1 MH (SUTURE) ×2 IMPLANT
SUT SURGIDAC NAB ES-9 0 48 120 (SUTURE) IMPLANT
SUT VIC AB 2-0 CT1 TAPERPNT 27 (SUTURE) ×2 IMPLANT
SUT VIC AB 3-0 SH 27X BRD (SUTURE) ×4 IMPLANT
SUT VICRYL 0 UR6 27IN ABS (SUTURE) ×4 IMPLANT
SYR 20ML ECCENTRIC (SYRINGE) ×2 IMPLANT
SYSTEM SAHARA CHEST DRAIN ATS (WOUND CARE) IMPLANT
TOWEL GREEN STERILE (TOWEL DISPOSABLE) ×2 IMPLANT
TOWEL GREEN STERILE FF (TOWEL DISPOSABLE) ×2 IMPLANT
TRAY FOLEY MTR SLVR 16FR STAT (SET/KITS/TRAYS/PACK) ×2 IMPLANT
TROCAR PORT AIRSEAL 8X120 (TROCAR) IMPLANT
TROCAR XCEL BLADELESS 5X75MML (TROCAR) ×2 IMPLANT
TROCAR XCEL NON-BLD 5MMX100MML (ENDOMECHANICALS) IMPLANT
TUBE CONNECTING 20X1/4 (TUBING) ×2 IMPLANT
TUBING ENDO SMARTCAP (MISCELLANEOUS) ×2 IMPLANT
UNDERPAD 30X36 HEAVY ABSORB (UNDERPADS AND DIAPERS) ×2 IMPLANT
WATER STERILE IRR 1000ML POUR (IV SOLUTION) ×2 IMPLANT

## 2023-11-09 NOTE — Discharge Instructions (Signed)

## 2023-11-09 NOTE — Op Note (Signed)
 [address]       [place] [address]             [phone]        11/09/2023  Patient:  [name] Pre-Op Dx: Hiatal Hernia Gastroesophageal reflux   Post-op Dx:  same Procedure: - Esophagoscopy - Robotic assisted laparoscopy - Paraesophageal hernia repair with Ovitex pledgets   Surgeon and Role:      * Hilarie Lovely, MD - Primary  Assistant: Melven Stable. Patsi Boots, PA-C  An experienced assistant was required given the complexity of this surgery and the standard of surgical care. The assistant was needed for exposure, dissection, suctioning, retraction of delicate tissues and sutures, instrument exchange and for overall help during this procedure.   Anesthesia  general EBL:  50ml Blood Administration: non Specimen:  none   Counts: correct   Indications: 63yo female with a hiatal hernia.  I personally reviewed her imaging from [facility], and she does have a small hiatal hernia.  Her most recent EGD shows that she has a 6cm sliding hiatal hernia.  Given her symptoms, I think that a hiatal hernia repair with fundoplication is reasonable.  She is agreeable to proceed.    Findings: Replaced left hepatic.  Artery was mobilized and preserved.  For this reason, a fundoplication was not performed.  Operative Technique: After the risks, benefits and alternatives were thoroughly discussed, the patient was brought to the operative theatre.  Anesthesia was induced, and the esophagoscope was passed through the oropharynx down to the stomach.  The scope was retroflexed and the hiatal hernia was clearly evident.  The scope was pulled back and the mucosal surface of the esophagus was visualized.    The scope was then parked at 25 cm from the incisors.  The patient was then prepped and draped in normal sterile fashion.  An appropriate surgical pause was performed, and pre-operative antibiotics were dosed accordingly.  We began with a 1 cm incision 15 cm caudad from  the xiphoid and slightly lateral to the umbilicus.  Using an Optiview we entered the peritoneal space.  The abdomen was then insufflated with CO2.  3 other robotic ports were placed to triangulate the hiatus.  Another 12 mm port was placed in place at the level of the umbilicus laterally for an assistant port and another 5 mm trocar was placed in the right lower quadrant for liver retractor.  The patient was then placed in steep reverse Trendelenburg and the liver was elevated to expose the esophageal hiatus.  And then the robot was docked.  We began by dividing the gastrohepatic ligament to expose the right diaphragmatic crus and then dissected the hernia sac in a clockwise fashion to mobilize there the stomach and esophagus.  We then divided the short gastrics and moved towards the right crus and completed our dissection along the esophageal hiatus.  A Penrose drain was then used to encircle the the esophagus and we continued our dissection up into the mediastinum.  Once we had achieved 3 to 4 cm of intra-abdominal esophagus we then proceeded to reapproximate the crura with 0 Ethibond sutures in an interrupted fashion with mesh pledgets.  The gastroscope was passed down through the lower esophageal sphincter into the stomach and would act as our bougie during this repair.    An air leak test was performed using the gastroscope.  No leak was evident.  The liver retractor was removed and all ports were removed  under direct visualization.  The skin and soft tissue were closed with absorbable suture    The patient tolerated the procedure without any immediate complications, and was transferred to the PACU in stable condition.  Rushawn Capshaw [place]

## 2023-11-09 NOTE — Hospital Course (Signed)
 Referring: Jinny Mounts, PA-C Primary Care: Tura Gaines, MD     History of Present Illness:    [name] 63 y.o. female referred for surgical evaluation of moderate to large hiatal hernia.  Her symptoms have recently worsened.  She regularly has dysphagia, and wakes up hoarse, and often need to clear her throat.  She also has had to increase her anti-acid medication.   She has undergone hip surgery and is doing well from that.  She continues to have reflux, and dysphagia. I personally reviewed her imaging from [facility], and she does have a small hiatal hernia.  Her most recent EGD shows that she has a 6cm sliding hiatal hernia.  Given her symptoms, I think that a hiatal hernia repair with fundoplication is reasonable.  She is agreeable to proceed.     Hospital Course: [name] was admitted for elective surgery on 11/09/2023 and taken to the operating room where robotic assisted hiatal hernia repair was carried out without complication.  After the procedure, she was extubated in the operating room and recovered in the postanesthesia care unit.  She was later transferred to [facility].

## 2023-11-09 NOTE — Anesthesia Procedure Notes (Signed)
 Procedure Name: Intubation Date/Time: 11/09/2023 12:50 PM  Performed by: Pauletta Boroughs, CRNAPre-anesthesia Checklist: Patient identified, Emergency Drugs available, Suction available and Patient being monitored Patient Re-evaluated:Patient Re-evaluated prior to induction Oxygen Delivery Method: Circle system utilized Preoxygenation: Pre-oxygenation with 100% oxygen Induction Type: IV induction Ventilation: Mask ventilation without difficulty Laryngoscope Size: Miller and 2 Grade View: Grade I Tube type: Oral Tube size: 7.0 mm Number of attempts: 1 Airway Equipment and Method: Stylet and Oral airway Placement Confirmation: ETT inserted through vocal cords under direct vision, positive ETCO2 and breath sounds checked- equal and bilateral Secured at: 21 (@ teeth) cm Tube secured with: Tape Dental Injury: Teeth and Oropharynx as per pre-operative assessment  Comments: Atraumatic. ETT placed by Katherne Pander, SRNA

## 2023-11-09 NOTE — Discharge Summary (Signed)
 Physician Discharge Summary  Patient ID: [name] MRN: [id number] DOB/AGE: 30-Jun-1961 63 y.o.  Admit date: 11/09/2023 Discharge date: 11/10/2023  Admission Diagnoses:  Moderate to large hiatal hernia Gastroesophageal reflux disease History of depression Hypertension Dyslipidemia Lactose intolerance Degenerative joint disease  Discharge Diagnoses:   Status post hernia repair Moderate to large hiatal hernia Gastroesophageal reflux disease History of depression Hypertension Dyslipidemia Lactose intolerance Degenerative joint disease  Discharged Condition: stable  Referring: Jinny Mounts, PA-C Primary Care: Tura Gaines, MD     History of Present Illness:    [name] 63 y.o. female referred for surgical evaluation of moderate to large hiatal hernia.  Her symptoms have recently worsened.  She regularly has dysphagia, and wakes up hoarse, and often need to clear her throat.  She also has had to increase her anti-acid medication.   She has undergone hip surgery and is doing well from that.  She continues to have reflux, and dysphagia. I personally reviewed her imaging from [facility], and she does have a small hiatal hernia.  Her most recent EGD shows that she has a 6cm sliding hiatal hernia.  Given her symptoms, I think that a hiatal hernia repair with fundoplication is reasonable.  She is agreeable to proceed.     Hospital Course: Ms. Rosato was admitted for elective surgery on 11/09/2023 and taken to the operating room where robotic assisted hiatal hernia repair was carried out without complication.  After the procedure, she was extubated in the operating room and recovered in the postanesthesia care unit.  She was later transferred to [facility].  Vital signs and cardiac rhythm remained stable.  She remained n.p.o. until we obtained an esophagram on the morning of postop day 1.  This study showed normal swallow function with no evidence of leak.  She was  started on a dysphagia 1 diet which she tolerated without any difficulty.  She was mobilized and had no difficulty with walking or transfers.  She was ready for discharge to home as planned on the afternoon of postop day 1.  Consults: None  Significant Diagnostic Studies:  CLINICAL DATA:  63 year old female with recent hiatal hernia repair for esophagram.   EXAM: ESOPHAGUS/BARIUM SWALLOW/TABLET STUDY   TECHNIQUE: Single contrast examination was performed using thin liquid barium. This exam was performed by Lorinda Root, PA-C, and was supervised and interpreted by Dr. Darylene Epley.   FLUOROSCOPY: Radiation Exposure Index and estimated peak skin dose (PSD);   Reference air kerma (RAK), 28.2 mGy. Kerma-area product (KAP), 589.3 uGy*m.   COMPARISON:  Chest XR, 11/10/2023.  CT AP, 06/18/2016   FINDINGS: Swallowing: Appears normal. No vestibular penetration or aspiration seen.   Pharynx: Unremarkable.   Esophagus: Normal appearance. No mucosal lesions or definitive stricture.   Esophageal motility: Within normal limits. Barium passed without difficulty.   Hiatal Hernia: None visualized.   Gastroesophageal reflux: None visualized, even with provocative maneuvers.   Ingested 13mm barium tablet: Not given.   Other: None.   IMPRESSION: Postoperative changes without fluoroscopic evidence of recurrent hiatus hernia or acute complication.   Performed By Lorinda Root, PA-C     Electronically Signed   By: Art Largo M.D.   On: 11/10/2023 12:20  Treatments: 11/09/2023   Patient:  [name] Pre-Op Dx: Hiatal Hernia Gastroesophageal reflux   Post-op Dx:  same Procedure: - Esophagoscopy - Robotic assisted laparoscopy - Paraesophageal hernia repair with Ovitex pledgets     Surgeon and Role:      *  Hilarie Lovely, MD - Primary   Assistant: Melven Stable. Patsi Boots, PA-C  An experienced assistant was required given the complexity of this surgery and the standard  of surgical care. The assistant was needed for exposure, dissection, suctioning, retraction of delicate tissues and sutures, instrument exchange and for overall help during this procedure.   Anesthesia  general EBL:  50ml Blood Administration: non Specimen:  none     Counts: correct     Indications: 62yo female with a hiatal hernia.  I personally reviewed her imaging from [facility], and she does have a small hiatal hernia.  Her most recent EGD shows that she has a 6cm sliding hiatal hernia.  Given her symptoms, I think that a hiatal hernia repair with fundoplication is reasonable.  She is agreeable to proceed.     Findings: Replaced left hepatic.  Artery was mobilized and preserved.  For this reason, a fundoplication was not performed.  Discharge Exam: Blood pressure 135/81, pulse 86, temperature 98.6 F (37 C), temperature source Oral, resp. rate 19, height 5\' 2"  (1.575 m), weight 70.8 kg, SpO2 92%.  General appearance: alert, cooperative, and no distress Neurologic: intact Heart: RRR Lungs: clear to auscultation bilaterally Abdomen: soft, flat, expected tenderness at port sites. Few bowel sounds Wound: port sites are intact and dry.  Disposition: Discharge disposition: 01-Home or Self Care     Discharged to home in stable condition.   Allergies as of 11/10/2023       Reactions   Penicillins Swelling   tongue   Compazine Diarrhea   Prochlorperazine Edisylate Diarrhea   Venlafaxine Itching   Pristiq is tolerated.  Effexor was used on recommendation by insurance coverer        Medication List     STOP taking these medications    benzonatate 100 MG capsule Commonly known as: TESSALON   Biotin 1000 MCG tablet   CO Q10 PO   hyoscyamine  0.125 MG SL tablet Commonly known as: LEVSIN SL   omeprazole  40 MG capsule Commonly known as: PRILOSEC Replaced by: omeprazole  2 mg/mL Susp oral suspension   ondansetron  4 MG tablet Commonly known as: Zofran        TAKE  these medications    Acetaminophen  Extra Strength 500 MG Tabs Take 2 tablets (1,000 mg total) by mouth every 8 (eight) hours as needed for mild pain (pain score 1-3), fever or headache (pain.).   HYDROcodone -acetaminophen  7.5-325 mg/15 ml solution Commonly known as: HYCET Take 15 mLs by mouth 4 (four) times daily as needed for up to 5 days for moderate pain (pain score 4-6).   lisinopril 20 MG tablet Commonly known as: ZESTRIL Take 20 mg by mouth in the morning.   Melatonin 10 MG Tabs Take 10 mg by mouth at bedtime.   meloxicam 15 MG tablet Commonly known as: MOBIC Take 15 mg by mouth daily.   omeprazole  2 mg/mL Susp oral suspension Commonly known as: KONVOMEP Take 10 mLs (20 mg total) by mouth daily. Replaces: omeprazole  40 MG capsule   ondansetron  4 MG disintegrating tablet Commonly known as: ZOFRAN -ODT Take 1 tablet (4 mg total) by mouth every 8 (eight) hours as needed for nausea or vomiting.   Pristiq 100 MG 24 hr tablet Generic drug: desvenlafaxine Take 100 mg by mouth in the morning.   rOPINIRole 1 MG tablet Commonly known as: REQUIP Take 1 mg by mouth in the morning and at bedtime.   rosuvastatin 10 MG tablet Commonly known as: CRESTOR Take 10 mg by mouth  in the morning.        Follow-up Information     Hilarie Lovely, MD. Go on 11/20/2023.   Specialty: Cardiothoracic Surgery Why: Your appointment is at 10:50am. Contact information: [address] [place] [address] [phone]                  Signed: Leata Providence, PA-C 11/10/2023, 2:04 PM

## 2023-11-09 NOTE — Interval H&P Note (Signed)
 History and Physical Interval Note:  11/09/2023 11:54 AM  [name]  has presented today for surgery, with the diagnosis of PEH.  The various methods of treatment have been discussed with the patient and family. After consideration of risks, benefits and other options for treatment, the patient has consented to  Procedure(s): REPAIR, HERNIA, HIATAL, ROBOT-ASSISTED (N/A) EGD (ESOPHAGOGASTRODUODENOSCOPY) (N/A) as a surgical intervention.  The patient's history has been reviewed, patient examined, no change in status, stable for surgery.  I have reviewed the patient's chart and labs.  Questions were answered to the patient's satisfaction.     Tishara Pizano Ala Alice

## 2023-11-09 NOTE — Anesthesia Preprocedure Evaluation (Addendum)
 Anesthesia Evaluation  Patient identified by MRN, date of birth, ID band Patient awake    Reviewed: Allergy & Precautions, H&P , NPO status , Patient's Chart, lab work & pertinent test results  History of Anesthesia Complications (+) PONV and history of anesthetic complications  Airway Mallampati: II   Neck ROM: full    Dental   Pulmonary neg pulmonary ROS   breath sounds clear to auscultation       Cardiovascular hypertension,  Rhythm:regular Rate:Normal     Neuro/Psych  Headaches PSYCHIATRIC DISORDERS  Depression     Neuromuscular disease    GI/Hepatic hiatal hernia,GERD  ,,  Endo/Other    Renal/GU      Musculoskeletal  (+) Arthritis ,    Abdominal   Peds  Hematology   Anesthesia Other Findings   Reproductive/Obstetrics                             Anesthesia Physical Anesthesia Plan  ASA: 2  Anesthesia Plan: General   Post-op Pain Management:    Induction: Intravenous  PONV Risk Score and Plan: 4 or greater and Ondansetron , Dexamethasone , Midazolam  and Treatment may vary due to age or medical condition  Airway Management Planned: Oral ETT  Additional Equipment: ClearSight  Intra-op Plan:   Post-operative Plan: Extubation in OR  Informed Consent: I have reviewed the patients History and Physical, chart, labs and discussed the procedure including the risks, benefits and alternatives for the proposed anesthesia with the patient or authorized representative who has indicated his/her understanding and acceptance.     Dental advisory given  Plan Discussed with: CRNA, Anesthesiologist and Surgeon  Anesthesia Plan Comments:        Anesthesia Quick Evaluation

## 2023-11-09 NOTE — Brief Op Note (Signed)
 11/09/2023  3:28 PM  PATIENT:  [name]  62 y.o. female  PRE-OPERATIVE DIAGNOSIS: Hiatal hernia  POST-OPERATIVE DIAGNOSIS: Hiatal hernia  PROCEDURE:   REPAIR, HERNIA, HIATAL, ROBOT-ASSISTED  EGD (ESOPHAGOGASTRODUODENOSCOPY)  SURGEON:  Lightfoot, Marinell Siad, MD   PHYSICIAN ASSISTANT: Theia Dezeeuw  ASSISTANTS: Mackie Sayre, CST, Scrub Person         Goin, Sallye Crease, RN, Circulator   ANESTHESIA:   general  EBL:  50 mL   BLOOD ADMINISTERED:none  DRAINS: none   LOCAL MEDICATIONS USED: Exparel   SPECIMEN:  No Specimen  COUNTS: Correct  DICTATION: .Dragon Dictation  PLAN OF CARE: Admit to inpatient   PATIENT DISPOSITION:  PACU - hemodynamically stable.   Delay start of Pharmacological VTE agent (>24hrs) due to surgical blood loss or risk of bleeding: yes

## 2023-11-09 NOTE — Transfer of Care (Signed)
 Immediate Anesthesia Transfer of Care Note  Patient: [name]  Procedure(s) Performed: REPAIR, HERNIA, HIATAL, ROBOT-ASSISTED (Chest) EGD (ESOPHAGOGASTRODUODENOSCOPY)  Patient Location: PACU  Anesthesia Type:General  Level of Consciousness: awake, alert , and oriented  Airway & Oxygen Therapy: Patient Spontanous Breathing and Patient connected to nasal cannula oxygen  Post-op Assessment: Report given to RN and Post -op Vital signs reviewed and stable  Post vital signs: Reviewed and stable  Last Vitals:  Vitals Value Taken Time  BP 163/81 11/09/23 1535  Temp 36.9 C 11/09/23 1535  Pulse 94 11/09/23 1539  Resp 13 11/09/23 1539  SpO2 96 % 11/09/23 1539  Vitals shown include unfiled device data.  Last Pain:  Vitals:   11/09/23 1535  TempSrc:   PainSc: 0-No pain         Complications: No notable events documented.

## 2023-11-10 ENCOUNTER — Encounter (HOSPITAL_COMMUNITY): Payer: Self-pay | Admitting: Thoracic Surgery (Cardiothoracic Vascular Surgery)

## 2023-11-10 ENCOUNTER — Inpatient Hospital Stay (HOSPITAL_COMMUNITY)

## 2023-11-10 DIAGNOSIS — K449 Diaphragmatic hernia without obstruction or gangrene: Secondary | ICD-10-CM | POA: Diagnosis not present

## 2023-11-10 LAB — BASIC METABOLIC PANEL WITH GFR
Anion gap: 13 (ref 5–15)
BUN: 11 mg/dL (ref 8–23)
CO2: 23 mmol/L (ref 22–32)
Calcium: 9.2 mg/dL (ref 8.9–10.3)
Chloride: 103 mmol/L (ref 98–111)
Creatinine, Ser: 0.8 mg/dL (ref 0.44–1.00)
GFR, Estimated: >60 mL/min (ref >60)
Glucose, Bld: 137 mg/dL — ABNORMAL HIGH (ref 70–99)
Potassium: 4.2 mmol/L (ref 3.5–5.1)
Sodium: 139 mmol/L (ref 135–145)

## 2023-11-10 LAB — CBC
HCT: 37.6 % (ref 36.0–46.0)
Hemoglobin: 12.4 g/dL (ref 12.0–15.0)
MCH: 27.5 pg (ref 26.0–34.0)
MCHC: 33 g/dL (ref 30.0–36.0)
MCV: 83.4 fL (ref 80.0–100.0)
Platelets: 231 10*3/uL (ref 150–400)
RBC: 4.51 MIL/uL (ref 3.87–5.11)
RDW: 12.6 % (ref 11.5–15.5)
WBC: 10.6 10*3/uL — ABNORMAL HIGH (ref 4.0–10.5)
nRBC: 0 % (ref 0.0–0.2)

## 2023-11-10 MED ORDER — HYDROCODONE-ACETAMINOPHEN 7.5-325 MG/15ML PO SOLN: 15.0000 mL | Freq: Four times a day (QID) | ORAL | 0 refills | Status: DC | PRN

## 2023-11-10 MED ORDER — IOHEXOL 300 MG/ML  SOLN
100.0000 mL | Freq: Once | INTRAMUSCULAR | Status: AC | PRN
  Administered 2023-11-10: 80 mL via ORAL

## 2023-11-10 MED ORDER — ONDANSETRON 4 MG PO TBDP: 4.0000 mg | ORAL_TABLET | Freq: Three times a day (TID) | ORAL | 1 refills | Status: DC | PRN

## 2023-11-10 MED ORDER — SIMETHICONE 80 MG PO CHEW
80.0000 mg | CHEWABLE_TABLET | Freq: Once | ORAL | Status: AC
  Administered 2023-11-10: 80 mg via ORAL
  Filled 2023-11-10: qty 1

## 2023-11-10 MED ORDER — OMEPRAZOLE 2 MG/ML ORAL SUSPENSION: 20.0000 mg | Freq: Every day | ORAL | 1 refills | Status: DC

## 2023-11-10 NOTE — Plan of Care (Signed)

## 2023-11-10 NOTE — Progress Notes (Signed)
 Patient and husband given AVS and discharge education. Patient educated on crushing medications until her appointment. IV removed prior to discharge.

## 2023-11-10 NOTE — Progress Notes (Signed)
 1 Day Post-Op Procedure(s) (LRB): REPAIR, HERNIA, HIATAL, ROBOT-ASSISTED (N/A) EGD (ESOPHAGOGASTRODUODENOSCOPY) (N/A) Subjective: Resting in bed, awake and alert.  Pain controlled, no nausea.  Awaiting esophagram.  Objective: Vital signs in last 24 hours: Temp:  [97.9 F (36.6 C)-98.9 F (37.2 C)] 98.4 F (36.9 C) (05/13 0735) Pulse Rate:  [81-99] 93 (05/13 0735) Cardiac Rhythm: Normal sinus rhythm (05/12 2000) Resp:  [13-21] 20 (05/13 0735) BP: (143-168)/(81-98) 148/91 (05/13 0735) SpO2:  [90 %-98 %] 92 % (05/13 0735) Weight:  [70.8 kg] 70.8 kg (05/12 0905)    Intake/Output from previous day: 05/12 0701 - 05/13 0700 In: 1900 [I.V.:1700; IV Piggyback:200] Out: 150 [Urine:100; Blood:50] Intake/Output this shift: No intake/output data recorded.  General appearance: alert, cooperative, and no distress Neurologic: intact Heart: RRR Lungs: clear to auscultation bilaterally Abdomen: soft, flat, expected tenderness at port sites. Few bowel sounds Wound: port sites are intact and dry.  Lab Results: Recent Labs    11/10/23 0237  WBC 10.6*  HGB 12.4  HCT 37.6  PLT 231   BMET:  Recent Labs    11/10/23 0237  NA 139  K 4.2  CL 103  CO2 23  GLUCOSE 137*  BUN 11  CREATININE 0.80  CALCIUM 9.2    PT/INR: No results for input(s): "LABPROT", "INR" in the last 72 hours. ABG    Component Value Date/Time   TCO2 23 12/26/2008 0738   CBG (last 3)  No results for input(s): "GLUCAP" in the last 72 hours.  Assessment/Plan: S/P Procedure(s) (LRB): REPAIR, HERNIA, HIATAL, ROBOT-ASSISTED (N/A) EGD (ESOPHAGOGASTRODUODENOSCOPY) (N/A)  -POD1 repair hiatal hernia. Stable recovery thus far.Pain controlled.  Awaiting esophagram this morning and if OK will start D1 diet.   -H/O HTN: BP150-160. PRN hydralazine until able to resume her oral Rx.   -DVT PPX- mobilize, enoxaparin.   LOS: 1 day    [name] G. Leslie Trosper, PA-C 11/10/2023

## 2023-11-11 ENCOUNTER — Other Ambulatory Visit: Payer: Self-pay | Admitting: Physician Assistant

## 2023-11-11 MED ORDER — TRAMADOL 5 MG/ML ORAL SUSPENSION: 50.0000 mg | Freq: Four times a day (QID) | ORAL | 0 refills | Status: DC | PRN

## 2023-11-11 NOTE — Anesthesia Postprocedure Evaluation (Signed)
 Anesthesia Post Note  Patient: LORETTO VILLARI  Procedure(s) Performed: REPAIR, HERNIA, HIATAL, ROBOT-ASSISTED (Chest) EGD (ESOPHAGOGASTRODUODENOSCOPY)     Patient location during evaluation: PACU Anesthesia Type: General Level of consciousness: awake and alert Pain management: pain level controlled Vital Signs Assessment: post-procedure vital signs reviewed and stable Respiratory status: spontaneous breathing, nonlabored ventilation, respiratory function stable and patient connected to nasal cannula oxygen Cardiovascular status: blood pressure returned to baseline and stable Postop Assessment: no apparent nausea or vomiting Anesthetic complications: no   No notable events documented.  Last Vitals:  Vitals:   11/10/23 0735 11/10/23 1110  BP: (!) 148/91 135/81  Pulse: 93 86  Resp: 20 19  Temp: 36.9 C 37 C  SpO2: 92% 92%    Last Pain:  Vitals:   11/10/23 1223  TempSrc:   PainSc: 3                  Quintus Premo S

## 2023-11-18 NOTE — Progress Notes (Signed)
      [address]       [place] [address]             [phone]        [name] [facility] Medical Record #[id number] Date of Birth: 07-Feb-1961  Referring: Jinny Mounts, PA-C Primary Care: Lazoff, Shawn P, DO Primary Cardiologist:None  Reason for visit:   follow-up  History of Present Illness:     [name] present for her follow-up.  Overall she is doing well.  She denies any dysphagia or reflux.  Her pain is well controlled  Physical Exam: There were no vitals taken for this visit.  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  She is doing well.  She will follow-up in 1 month with a CXR.   [name] 11/18/2023 10:01 AM

## 2023-11-20 ENCOUNTER — Telehealth: Payer: Self-pay

## 2023-11-20 ENCOUNTER — Encounter: Payer: Self-pay | Admitting: Thoracic Surgery (Cardiothoracic Vascular Surgery)

## 2023-11-20 ENCOUNTER — Ambulatory Visit
Attending: Thoracic Surgery (Cardiothoracic Vascular Surgery) | Admitting: Thoracic Surgery (Cardiothoracic Vascular Surgery)

## 2023-11-20 VITALS — BP 125/75 | HR 99 | Resp 20 | Ht 62.0 in | Wt 152.5 lb

## 2023-11-20 DIAGNOSIS — Z9889 Other specified postprocedural states: Secondary | ICD-10-CM

## 2023-11-20 DIAGNOSIS — Z8719 Personal history of other diseases of the digestive system: Secondary | ICD-10-CM

## 2023-11-20 NOTE — Telephone Encounter (Signed)
 Patient contacted the office concerned after her appointment today and eating lunch. She was seen in the office today and her diet was advanced to "fork tender" foods per Dr. Deloise Ferries. She had pancakes, eggs, grits, and bacon. She states that after eating a bite of pancake she had extreme chest pains in the center of her chest. She states that it did subside but came back and has gone again. She states that she is feeling better at the time of the call. She also states that she did take small bites. Per Dr. Deloise Ferries patient is to eat small amounts of foods and chew the food really well before swallowing. Advised to not eat bread as this is not always fork tender. She acknowledged receipt.

## 2023-12-21 ENCOUNTER — Other Ambulatory Visit: Payer: Self-pay | Admitting: Thoracic Surgery (Cardiothoracic Vascular Surgery)

## 2023-12-21 DIAGNOSIS — K449 Diaphragmatic hernia without obstruction or gangrene: Secondary | ICD-10-CM

## 2023-12-25 ENCOUNTER — Ambulatory Visit
Attending: Thoracic Surgery (Cardiothoracic Vascular Surgery) | Admitting: Thoracic Surgery (Cardiothoracic Vascular Surgery)

## 2023-12-25 ENCOUNTER — Ambulatory Visit (HOSPITAL_COMMUNITY)
Admission: RE | Admit: 2023-12-25 | Discharge: 2023-12-25 | Disposition: A | Discharge status: Home / Self Care | Source: Ambulatory Visit | Attending: Cardiology | Admitting: Cardiology

## 2023-12-25 VITALS — BP 136/80 | HR 91 | Resp 18 | Ht 62.0 in | Wt 152.0 lb

## 2023-12-25 DIAGNOSIS — Z01810 Encounter for preprocedural cardiovascular examination: Secondary | ICD-10-CM | POA: Diagnosis present

## 2023-12-25 DIAGNOSIS — K449 Diaphragmatic hernia without obstruction or gangrene: Secondary | ICD-10-CM | POA: Insufficient documentation

## 2023-12-25 DIAGNOSIS — Z9889 Other specified postprocedural states: Secondary | ICD-10-CM

## 2023-12-25 DIAGNOSIS — Z8719 Personal history of other diseases of the digestive system: Secondary | ICD-10-CM

## 2023-12-28 NOTE — Progress Notes (Signed)
      [address]       [place] [address]             [phone]        [name] [facility] Medical Record #[id number] Date of Birth: Sep 03, 1960  Referring: Leslie Pfeiffer, PA-C Primary Care: Lazoff, Shawn P, DO Primary Cardiologist:None  Reason for visit:   follow-up  History of Present Illness:     [name] present for her 1 month appointment.  Overall, she is doing well.  She has lost some weight.  She denies any reflux, or dysphagia  Physical Exam: BP 136/80   Pulse 91   Resp 18   Ht 5' 2 (1.575 m)   Wt 152 lb (68.9 kg)   SpO2 97%   BMI 27.80 kg/m   Alert NAD Abdomen, ND no peripheral edema   Diagnostic Studies & Laboratory data: CXR:  clear     Assessment / Plan:   63 y.o. female s/[name] PEH repair.  She is clear for all activity.  She will follow-up in 3 months for a symptom check   [name] 12/28/2023 7:57 AM

## 2024-02-08 DIAGNOSIS — R1310 Dysphagia, unspecified: Secondary | ICD-10-CM

## 2024-03-25 ENCOUNTER — Ambulatory Visit
Attending: Thoracic Surgery (Cardiothoracic Vascular Surgery) | Admitting: Thoracic Surgery (Cardiothoracic Vascular Surgery)

## 2024-03-25 DIAGNOSIS — Z9889 Other specified postprocedural states: Secondary | ICD-10-CM

## 2024-03-25 DIAGNOSIS — Z8719 Personal history of other diseases of the digestive system: Secondary | ICD-10-CM | POA: Diagnosis not present

## 2024-03-25 MED ORDER — OMEPRAZOLE MAGNESIUM 20 MG PO TBEC: 40.0000 mg | DELAYED_RELEASE_TABLET | Freq: Every day | ORAL | 3 refills | Status: AC

## 2024-03-25 NOTE — Progress Notes (Signed)
     [address]       [name] [address]             [phone]       Patient: Home Provider: Office Consent for Telemedicine visit obtained.  Today's visit was completed via a real-time telehealth (see specific modality noted below). The patient/authorized person provided oral consent at the time of the visit to engage in a telemedicine encounter with the present provider at [facility]. The patient/authorized person was informed of the potential benefits, limitations, and risks of telemedicine. The patient/authorized person expressed understanding that the laws that protect confidentiality also apply to telemedicine. The patient/authorized person acknowledged understanding that telemedicine does not provide emergency services and that he or she would need to call 911 or proceed to the nearest hospital for help if such a need arose.   Total time spent in the clinical discussion 10 minutes.  Telehealth Modality: Phone visit (audio only)  I had a telephone visit with [name] She is s/p PEH repair.  Overall, she is doing well.  She has a little bit of reflux that started about a month ago.  She denies any dysphagia.  She did not have a fundoplication do to her replaced left hepatic artery.  She did not want a repeat swallow study, and just wants to treat her symptoms with omeprazole .  [name] [name]

## 2024-06-18 ENCOUNTER — Other Ambulatory Visit: Payer: Self-pay | Admitting: Physician Assistant
